# Patient Record
Sex: Female | Born: 1935 | Race: White | Hispanic: No | Smoking: Never smoker
Health system: Southern US, Community
[De-identification: ages and names within clinical notes are randomized; demographics above are authoritative.]

## PROBLEM LIST (undated history)

## (undated) DIAGNOSIS — E119 Type 2 diabetes mellitus without complications: Secondary | ICD-10-CM

## (undated) DIAGNOSIS — E079 Disorder of thyroid, unspecified: Secondary | ICD-10-CM

## (undated) DIAGNOSIS — I10 Essential (primary) hypertension: Secondary | ICD-10-CM

## (undated) HISTORY — PX: EYE SURGERY: SHX253

## (undated) HISTORY — PX: ABDOMINAL HYSTERECTOMY: SHX81

## (undated) HISTORY — PX: CHOLECYSTECTOMY: SHX55

## (undated) HISTORY — PX: SMALL INTESTINE SURGERY: SHX150

---

## 2005-01-30 ENCOUNTER — Ambulatory Visit: Payer: Self-pay | Admitting: Orthopaedic Surgery

## 2005-02-03 ENCOUNTER — Ambulatory Visit: Payer: Self-pay | Admitting: Orthopaedic Surgery

## 2007-03-20 ENCOUNTER — Emergency Department: Payer: Self-pay | Admitting: Emergency Medicine

## 2011-04-22 ENCOUNTER — Inpatient Hospital Stay: Payer: Self-pay | Admitting: Internal Medicine

## 2011-11-20 ENCOUNTER — Inpatient Hospital Stay: Payer: Self-pay | Admitting: Internal Medicine

## 2011-11-20 LAB — CBC WITH DIFFERENTIAL/PLATELET
Basophil #: 0 10*3/uL (ref 0.0–0.1)
Eosinophil #: 0 10*3/uL (ref 0.0–0.7)
Eosinophil %: 0.2 %
HCT: 38.2 % (ref 35.0–47.0)
HGB: 13.3 g/dL (ref 12.0–16.0)
Lymphocyte #: 0.5 10*3/uL — ABNORMAL LOW (ref 1.0–3.6)
Lymphocyte %: 4.2 %
MCH: 31 pg (ref 26.0–34.0)
MCV: 89 fL (ref 80–100)
Monocyte %: 2.9 %
Neutrophil #: 11.7 10*3/uL — ABNORMAL HIGH (ref 1.4–6.5)
Neutrophil %: 92.7 %
Platelet: 184 10*3/uL (ref 150–440)
RBC: 4.3 10*6/uL (ref 3.80–5.20)
WBC: 12.6 10*3/uL — ABNORMAL HIGH (ref 3.6–11.0)

## 2011-11-20 LAB — CK TOTAL AND CKMB (NOT AT ARMC)
CK, Total: 49 U/L (ref 21–215)
CK, Total: 68 U/L (ref 21–215)
CK-MB: 0.5 ng/mL (ref 0.5–3.6)

## 2011-11-20 LAB — COMPREHENSIVE METABOLIC PANEL
Albumin: 3.2 g/dL — ABNORMAL LOW (ref 3.4–5.0)
Anion Gap: 13 (ref 7–16)
Bilirubin,Total: 1.4 mg/dL — ABNORMAL HIGH (ref 0.2–1.0)
EGFR (Non-African Amer.): 30 — ABNORMAL LOW
Osmolality: 283 (ref 275–301)
Potassium: 3.8 mmol/L (ref 3.5–5.1)
SGOT(AST): 44 U/L — ABNORMAL HIGH (ref 15–37)
Total Protein: 6.9 g/dL (ref 6.4–8.2)

## 2011-11-20 LAB — URINALYSIS, COMPLETE
Bilirubin,UR: NEGATIVE
Glucose,UR: 150 mg/dL (ref 0–75)
Ketone: NEGATIVE
Nitrite: NEGATIVE
Ph: 5 (ref 4.5–8.0)
Specific Gravity: 1.012 (ref 1.003–1.030)
Squamous Epithelial: 1

## 2011-11-20 LAB — RAPID INFLUENZA A&B ANTIGENS

## 2011-11-20 LAB — TROPONIN I: Troponin-I: 0.09 ng/mL — ABNORMAL HIGH

## 2011-11-21 LAB — CBC WITH DIFFERENTIAL/PLATELET
Basophil #: 0 10*3/uL (ref 0.0–0.1)
Eosinophil %: 0.4 %
Lymphocyte #: 1 10*3/uL (ref 1.0–3.6)
Lymphocyte %: 8.1 %
MCV: 91 fL (ref 80–100)
Monocyte #: 1.1 10*3/uL — ABNORMAL HIGH (ref 0.0–0.7)
Monocyte %: 9.2 %
Neutrophil #: 10.2 10*3/uL — ABNORMAL HIGH (ref 1.4–6.5)
Platelet: 134 10*3/uL — ABNORMAL LOW (ref 150–440)
RBC: 3.6 10*6/uL — ABNORMAL LOW (ref 3.80–5.20)
RDW: 13.4 % (ref 11.5–14.5)
WBC: 12.4 10*3/uL — ABNORMAL HIGH (ref 3.6–11.0)

## 2011-11-21 LAB — LIPID PANEL
HDL Cholesterol: 31 mg/dL — ABNORMAL LOW (ref 40–60)
Triglycerides: 104 mg/dL (ref 0–200)

## 2011-11-21 LAB — CK TOTAL AND CKMB (NOT AT ARMC)
CK, Total: 89 U/L (ref 21–215)
CK-MB: 0.8 ng/mL (ref 0.5–3.6)

## 2011-11-21 LAB — BASIC METABOLIC PANEL
Calcium, Total: 7.8 mg/dL — ABNORMAL LOW (ref 8.5–10.1)
Chloride: 103 mmol/L (ref 98–107)
EGFR (African American): 32 — ABNORMAL LOW
EGFR (Non-African Amer.): 27 — ABNORMAL LOW
Osmolality: 282 (ref 275–301)
Sodium: 136 mmol/L (ref 136–145)

## 2011-11-21 LAB — HEPATIC FUNCTION PANEL A (ARMC)
SGOT(AST): 26 U/L (ref 15–37)
SGPT (ALT): 22 U/L

## 2011-11-21 LAB — URINE CULTURE

## 2011-11-21 LAB — TROPONIN I: Troponin-I: 0.1 ng/mL — ABNORMAL HIGH

## 2011-11-22 LAB — BASIC METABOLIC PANEL
Anion Gap: 11 (ref 7–16)
BUN: 29 mg/dL — ABNORMAL HIGH (ref 7–18)
Calcium, Total: 7.9 mg/dL — ABNORMAL LOW (ref 8.5–10.1)
Chloride: 104 mmol/L (ref 98–107)
Co2: 22 mmol/L (ref 21–32)
EGFR (African American): 39 — ABNORMAL LOW
EGFR (Non-African Amer.): 32 — ABNORMAL LOW
Glucose: 231 mg/dL — ABNORMAL HIGH (ref 65–99)
Sodium: 137 mmol/L (ref 136–145)

## 2011-11-22 LAB — CBC WITH DIFFERENTIAL/PLATELET
Basophil %: 0 %
HCT: 31.3 % — ABNORMAL LOW (ref 35.0–47.0)
Lymphocyte %: 7.6 %
MCHC: 33.4 g/dL (ref 32.0–36.0)
Monocyte %: 6 %
Neutrophil %: 86.4 %
Platelet: 127 10*3/uL — ABNORMAL LOW (ref 150–440)
RDW: 13.8 % (ref 11.5–14.5)
WBC: 11.4 10*3/uL — ABNORMAL HIGH (ref 3.6–11.0)

## 2011-11-22 LAB — CULTURE, BLOOD (SINGLE)

## 2011-11-23 LAB — CBC WITH DIFFERENTIAL/PLATELET
Basophil #: 0 10*3/uL (ref 0.0–0.1)
Basophil %: 0.2 %
Eosinophil #: 0 10*3/uL (ref 0.0–0.7)
HCT: 29.7 % — ABNORMAL LOW (ref 35.0–47.0)
Lymphocyte #: 1.2 10*3/uL (ref 1.0–3.6)
Lymphocyte %: 11.5 %
Monocyte #: 1.2 10*3/uL — ABNORMAL HIGH (ref 0.0–0.7)
Monocyte %: 11.2 %
Platelet: 147 10*3/uL — ABNORMAL LOW (ref 150–440)
RBC: 3.31 10*6/uL — ABNORMAL LOW (ref 3.80–5.20)
RDW: 13.3 % (ref 11.5–14.5)
WBC: 10.8 10*3/uL (ref 3.6–11.0)

## 2011-11-23 LAB — COMPREHENSIVE METABOLIC PANEL
Alkaline Phosphatase: 81 U/L (ref 50–136)
Anion Gap: 8 (ref 7–16)
BUN: 25 mg/dL — ABNORMAL HIGH (ref 7–18)
Calcium, Total: 8.1 mg/dL — ABNORMAL LOW (ref 8.5–10.1)
Chloride: 103 mmol/L (ref 98–107)
Co2: 22 mmol/L (ref 21–32)
EGFR (African American): 42 — ABNORMAL LOW
EGFR (Non-African Amer.): 35 — ABNORMAL LOW
Glucose: 180 mg/dL — ABNORMAL HIGH (ref 65–99)
Osmolality: 275 (ref 275–301)
Potassium: 3.9 mmol/L (ref 3.5–5.1)
Sodium: 133 mmol/L — ABNORMAL LOW (ref 136–145)
Total Protein: 5.7 g/dL — ABNORMAL LOW (ref 6.4–8.2)

## 2011-11-24 LAB — CBC WITH DIFFERENTIAL/PLATELET
Basophil #: 0 10*3/uL (ref 0.0–0.1)
Basophil %: 0.5 %
Eosinophil %: 2.4 %
HCT: 30.9 % — ABNORMAL LOW (ref 35.0–47.0)
HGB: 10.3 g/dL — ABNORMAL LOW (ref 12.0–16.0)
Lymphocyte #: 1.9 10*3/uL (ref 1.0–3.6)
Lymphocyte %: 22.1 %
MCH: 30.1 pg (ref 26.0–34.0)
MCHC: 33.3 g/dL (ref 32.0–36.0)
MCV: 90 fL (ref 80–100)
Monocyte #: 1.5 10*3/uL — ABNORMAL HIGH (ref 0.0–0.7)
Monocyte %: 17.3 %
Neutrophil %: 57.7 %
Platelet: 156 10*3/uL (ref 150–440)
RDW: 13.3 % (ref 11.5–14.5)
WBC: 8.8 10*3/uL (ref 3.6–11.0)

## 2011-11-24 LAB — BASIC METABOLIC PANEL
BUN: 22 mg/dL — ABNORMAL HIGH (ref 7–18)
Calcium, Total: 8.2 mg/dL — ABNORMAL LOW (ref 8.5–10.1)
Co2: 28 mmol/L (ref 21–32)
EGFR (African American): 44 — ABNORMAL LOW
Sodium: 135 mmol/L — ABNORMAL LOW (ref 136–145)

## 2014-11-13 ENCOUNTER — Ambulatory Visit: Payer: Self-pay | Admitting: Urology

## 2015-03-11 NOTE — H&P (Signed)
PATIENT NAME:  Sierra Warren, Sierra Warren MR#:  [id number] DATE OF BIRTH:  [birth year]  DATE OF ADMISSION:  11/20/2011   REFERRING PHYSICIAN: Dr. Lorenso Courier   PRIMARY CARE PHYSICIAN: Bethann Punches, MD   REASON FOR ADMISSION: Fever, urinary tract infection, pyelonephritis, sepsis.   HISTORY OF PRESENT ILLNESS: This is an 81 year old white female with past medical history of diabetes, hypertension, and transient ischemic attack who presents with fever which began around 3:30 a.m. They called 9-1-1. The patient had fallen. She had not had any head trauma. She had fever as high as 104, glucose 30, low blood pressure as per EMS. She came in with systolics in the 80's, given IV fluids, found to have a urinary tract infection and started on Cipro. The patient states that she had nausea and vomiting yesterday, fevers and chills yesterday, subjective chills. She was given Tylenol at home with minimal improvement and then spiked her fever and fell. She is also complaining of right flank pain. No chest pain. No shortness of breath. No lower extremity edema. We are asked to admit the patient for sepsis.    PAST MEDICAL HISTORY: 1. Diverticulosis status post GI bleed. 2. Hypothyroidism. 3. Postmenopausal state.  4. Diabetes. 5. Transient ischemic attack.  6. Hypertension.  7. Status post left shoulder dislocation left clavicle fracture.   PAST SURGICAL HISTORY:  1. Hysterectomy.  2. Cholecystectomy.   MEDICATIONS:  1. Ambien 10 mg daily.  2. Glimepiride 2 mg 2 tablets by mouth every morning. 3. Diovan/hydrochlorothiazide 80/12.5 once daily.  4. Lorazepam 1 mg by mouth as needed.  5. Mirapex 0.125 mg at bedtime. 6. Synthroid 100 mcg daily.  7. She said she just got a prescription for Januvia and does not know the dose.   DRUG ALLERGIES: Penicillin, codeine, Glucophage, temazepam.   FAMILY HISTORY: Brother and father both had lung cancer. Father died at age 47. Mother died in her 66's of coronary artery disease and  MI.   SOCIAL HISTORY: She does not smoke, drink, or use illicit drugs. She lives with her daughter. She has four children living, one who passed away of motor vehicle accident. She is able to do her activities of daily living normally.   REVIEW OF SYSTEMS: CONSTITUTIONAL: She has fever, fatigue, weakness. EYES: No blurred vision, double vision, pain, redness, inflammation, glaucoma. ENT: No tinnitus, ear pain, ear pain, allergies, epistaxis, discharge. She does have hearing loss. RESPIRATORY: No cough, wheezing, hemoptysis, painful respirations. CARDIOVASCULAR: No chest pain, orthopnea, edema, arrhythmia, dyspnea on exertion, palpitations, syncope. GI: Nausea and vomiting. She did have diarrhea yesterday. No abdominal pain, hematemesis, melena, gastroesophageal reflux disease. GU: No dysuria, hematuria, renal calculi, increased frequency. GYN: No breast mass, tenderness, vaginal discharge. She is postmenopausal. ENDOCRINE: No polyuria, nocturia, thyroid problems, heat or cold intolerance. HEME/LYMPH: No anemia, bruising, swollen cancer. INTEGUMENTARY: No acne, rash, change in mole, hair or skin. MUSCULOSKELETAL: No pain in back, shoulder, knee, hip, arthritis, swelling, gout. NEUROLOGIC: No numbness, weakness, dysarthria, epilepsy, tremor, vertigo, ataxia. PSYCH: No anxiety, insomnia, ADD, bipolar, depression.   PHYSICAL EXAMINATION:   VITAL SIGNS: Temperature 102, heart rate 114, respiratory rate 18, blood pressure 112/52, sating 88% on room air, now 95% on 2 liters.   GENERAL: The patient is well developed, well nourished, in no apparent distress, alert and oriented x3.   HEENT: Pupils equal and reactive to light and accommodation. Extraocular movements intact. Anicteric sclerae. No difficulty hearing. Oropharynx clear.   NECK: No JVD. No thyromegaly. No lymphadenopathy. No carotid bruits.  LUNGS: Clear to auscultation. No adventitious breath sounds. No increased work of breathing.    CARDIOVASCULAR: Regular, mildly tachycardic. No murmurs, gallops, or rubs appreciated. No lower extremity edema.   BREASTS: No obvious masses.   ABDOMEN: Soft, nontender, nondistended. Positive bowel sounds with flank pain bilaterally.   GU: Deferred.   MUSCULOSKELETAL: Strength 5 out of 5. No clubbing, cyanosis, or degenerative joint disease.   SKIN: No rashes, lesions, or induration.    LYMPH: No lymphadenopathy in cervical or supraclavicular area.   NEUROLOGIC: Cranial nerves II through XII are intact. Strength 5 out of 5.   PSYCH: Alert and oriented x3.   LABORATORY, DIAGNOSTIC, AND RADIOLOGICAL DATA: EKG shows sinus tachycardia. Glucose 288, BUN 29, creatinine 1.72, sodium 133, potassium 3.9, chloride 99, bicarb 21, anion gap 13, total protein 6.9, albumin 3.2, total bilirubin 1.4, alkaline phosphatase 96, AST 44, ALT 33. CK 49. MB less than 0.5. Troponin 0.12. White count 12.6, hemoglobin 13, hematocrit 38.2, platelets 184. Urinalysis showed 2+ bacteria, WBCs 114. Influenza was negative for A and B. Chest x-ray showed no acute changes identified.   ASSESSMENT AND PLAN: This is a pleasant 81 year old white female with history of diabetes, hypertension, and hypothyroidism who presents with fever, nausea, vomiting, and flank pain.  1. Sepsis as evidenced by tachycardia, fever, leukocytosis, hypertension most likely secondary to urinary tract infection. Will give IV Cipro which should be renally dosed. P.r.n. Tylenol. IV fluids. Send off blood cultures and urine cultures which were not sent by the ER earlier. Unfortunately, she has already received antibiotics before getting these cultures.  2. Acute renal failure from dehydration. Will avoid nephrotoxins and give IV fluids.   3. Respiratory failure. Will check ABG. This may be just from weakness and atelectasis.  4. Elevated troponin. This most likely is sepsis versus acute renal failure versus demand ischemia. Will give aspirin,  nitroglycerin, and p.r.n. Motrin. The patient cannot tolerate beta-blocker due to her hypotension. Will cycle cardiac enzymes. If troponin increases, will fully anti-coagulate.  5. Hypothyroidism. Will continue Synthroid.  6. Diabetes. Will continue sliding scale insulin and hold her oral hypoglycemics.  7. Elevated total bilirubin and AST. Most likely this is from sepsis. Will repeat hepatic function in the morning.   8. Low albumin. Will screen for malnutrition.  9. DVT prophylaxis. Maintain with aspirin and heparin subcutaneous.   10. The patient will get physical therapy evaluation and may need short-term rehab.   CODE STATUS: FULL CODE.   TOTAL TIME SPENT ON ADMISSION: 55 minutes of critical care time.   Thank you for allowing me to participate in the care of this patient.   ____________________________ Corie Chiquito. Lafayette Dragon, MD aaf:drc D: 11/20/2011 12:01:33 ET T: 11/20/2011 13:18:04 ET JOB#: [id number]  cc: Karolee Ohs A. Lafayette Dragon, MD, <Dictator> Danella Penton, MD Karolee Ohs Laverda Page MD ELECTRONICALLY SIGNED 11/20/2011 16:28

## 2015-03-11 NOTE — Discharge Summary (Signed)
PATIENT NAME:  Sierra Warren, Sierra Warren MR#:  [id number] DATE OF BIRTH:  1921-03-06  DATE OF ADMISSION:  11/20/2011 DATE OF DISCHARGE:  11/24/2011  DISCHARGE DIAGNOSES:  1. Systemic inflammatory response syndrome due to Escherichia coli sepsis.  2. Bronchitis.  3. Diabetes mellitus, non-insulin-requiring.  4. History of transient ischemic attack.  5. Hypothyroidism.  6. Hypertension.   DISCHARGE MEDICATIONS:  1. Ambien 10 mg at bedtime p.r.n.  2. Januvia 50 mg q.a.m.  3. Synthroid 100 mcg daily.  4. Lorazepam 1 mg daily p.r.n.  5. Mirapex 0.125 mg at bedtime.  6. Glimepiride 2 mg two tabs daily.  7. Levaquin 500 mg daily x5 days.  8. Tussionex 1 tsp b.i.d. p.r.n. cough.   REASON FOR ADMISSION: Elderly female who presents with hypotension, tachycardia, fever, and suprapubic pain with cough. Please see history and physical for history of present illness, past medical history, and physical exam.   HOSPITAL COURSE: The patient was admitted, found to have Escherichia coli in her blood x2 bottles sensitive to cefazolin. She was treated with Rocephin and Levaquin throughout the hospital course. Her fever improved. Her white count dropped down to 8000. She had some progressive cough and congestion. Chest x-ray did not show an infiltrate. Clinical exam was consistent with bronchitis. Her blood pressure medicine, Diovan, was held due to mild hypotension.     She had shown dramatic improvement and will go home on the above medications to follow-up with Dr. Hyacinth Meeker later this week.    ____________________________ Danella Penton, MD mfm:rbg D: 11/24/2011 08:09:00 ET T: 11/25/2011 11:25:11 ET JOB#: [id number]  cc: Danella Penton, MD, <Dictator> MARK Sherlene Shams MD ELECTRONICALLY SIGNED 11/27/2011 18:06

## 2015-11-23 ENCOUNTER — Ambulatory Visit: Payer: Self-pay | Admitting: Urology

## 2015-11-26 ENCOUNTER — Emergency Department: Payer: PPO

## 2015-11-26 ENCOUNTER — Encounter: Payer: Self-pay | Admitting: Emergency Medicine

## 2015-11-26 ENCOUNTER — Emergency Department
Admission: EM | Admit: 2015-11-26 | Discharge: 2015-11-26 | Disposition: A | Discharge status: Home / Self Care | Payer: PPO | Attending: Emergency Medicine | Admitting: Emergency Medicine

## 2015-11-26 DIAGNOSIS — R05 Cough: Secondary | ICD-10-CM | POA: Diagnosis not present

## 2015-11-26 DIAGNOSIS — R251 Tremor, unspecified: Secondary | ICD-10-CM | POA: Insufficient documentation

## 2015-11-26 DIAGNOSIS — R200 Anesthesia of skin: Secondary | ICD-10-CM | POA: Diagnosis not present

## 2015-11-26 DIAGNOSIS — N3000 Acute cystitis without hematuria: Secondary | ICD-10-CM | POA: Insufficient documentation

## 2015-11-26 DIAGNOSIS — Z880 Allergy status to penicillin: Secondary | ICD-10-CM | POA: Diagnosis not present

## 2015-11-26 DIAGNOSIS — I10 Essential (primary) hypertension: Secondary | ICD-10-CM | POA: Diagnosis not present

## 2015-11-26 DIAGNOSIS — G252 Other specified forms of tremor: Secondary | ICD-10-CM

## 2015-11-26 DIAGNOSIS — E119 Type 2 diabetes mellitus without complications: Secondary | ICD-10-CM | POA: Insufficient documentation

## 2015-11-26 HISTORY — DX: Disorder of thyroid, unspecified: E07.9

## 2015-11-26 HISTORY — DX: Type 2 diabetes mellitus without complications: E11.9

## 2015-11-26 HISTORY — DX: Essential (primary) hypertension: I10

## 2015-11-26 LAB — GLUCOSE, CAPILLARY: Glucose-Capillary: 192 mg/dL — ABNORMAL HIGH (ref 65–99)

## 2015-11-26 LAB — COMPREHENSIVE METABOLIC PANEL
ALBUMIN: 3.8 g/dL (ref 3.5–5.0)
ALT: 15 U/L (ref 14–54)
AST: 20 U/L (ref 15–41)
Alkaline Phosphatase: 101 U/L (ref 38–126)
Anion gap: 9 (ref 5–15)
BUN: 29 mg/dL — ABNORMAL HIGH (ref 6–20)
CO2: 25 mmol/L (ref 22–32)
Calcium: 9 mg/dL (ref 8.9–10.3)
Chloride: 97 mmol/L — ABNORMAL LOW (ref 101–111)
Creatinine, Ser: 1.34 mg/dL — ABNORMAL HIGH (ref 0.44–1.00)
GFR calc Af Amer: 39 mL/min — ABNORMAL LOW (ref >60)
GFR calc non Af Amer: 33 mL/min — ABNORMAL LOW (ref >60)
GLUCOSE: 196 mg/dL — AB (ref 65–99)
POTASSIUM: 4.3 mmol/L (ref 3.5–5.1)
SODIUM: 131 mmol/L — AB (ref 135–145)
TOTAL PROTEIN: 6.8 g/dL (ref 6.5–8.1)
Total Bilirubin: 0.7 mg/dL (ref 0.3–1.2)

## 2015-11-26 LAB — CBC WITH DIFFERENTIAL/PLATELET
Basophils Absolute: 0.1 10*3/uL (ref 0–0.1)
Basophils Relative: 1 %
Eosinophils Absolute: 0.2 10*3/uL (ref 0–0.7)
Eosinophils Relative: 2 %
HEMATOCRIT: 36.1 % (ref 35.0–47.0)
Hemoglobin: 12.2 g/dL (ref 12.0–16.0)
LYMPHS ABS: 3.4 10*3/uL (ref 1.0–3.6)
LYMPHS PCT: 27 %
MCH: 29.8 pg (ref 26.0–34.0)
MCHC: 33.7 g/dL (ref 32.0–36.0)
MCV: 88.3 fL (ref 80.0–100.0)
MONO ABS: 0.9 10*3/uL (ref 0.2–0.9)
MONOS PCT: 8 %
NEUTROS ABS: 7.9 10*3/uL — AB (ref 1.4–6.5)
Neutrophils Relative %: 62 %
Platelets: 246 10*3/uL (ref 150–440)
RBC: 4.09 MIL/uL (ref 3.80–5.20)
RDW: 12.7 % (ref 11.5–14.5)
WBC: 12.6 10*3/uL — ABNORMAL HIGH (ref 3.6–11.0)

## 2015-11-26 LAB — URINALYSIS COMPLETE WITH MICROSCOPIC (ARMC ONLY)
Bilirubin Urine: NEGATIVE
Glucose, UA: NEGATIVE mg/dL
KETONES UR: NEGATIVE mg/dL
NITRITE: POSITIVE — AB
PH: 5 (ref 5.0–8.0)
Protein, ur: NEGATIVE mg/dL
Specific Gravity, Urine: 1.004 — ABNORMAL LOW (ref 1.005–1.030)

## 2015-11-26 LAB — TSH: TSH: 0.944 u[IU]/mL (ref 0.350–4.500)

## 2015-11-26 LAB — MAGNESIUM: Magnesium: 1.7 mg/dL (ref 1.7–2.4)

## 2015-11-26 LAB — TROPONIN I: Troponin I: <0.03 ng/mL (ref <0.031)

## 2015-11-26 MED ORDER — MAGNESIUM SULFATE 2 GM/50ML IV SOLN
2.0000 g | Freq: Once | INTRAVENOUS | Status: AC
  Administered 2015-11-26: 2 g via INTRAVENOUS
  Filled 2015-11-26: qty 50

## 2015-11-26 MED ORDER — DEXTROSE 5 % IV SOLN
1.0000 g | Freq: Once | INTRAVENOUS | Status: AC
  Administered 2015-11-26: 1 g via INTRAVENOUS
  Filled 2015-11-26: qty 10

## 2015-11-26 MED ORDER — CEPHALEXIN 500 MG PO CAPS: 500.0000 mg | ORAL_CAPSULE | Freq: Four times a day (QID) | ORAL | Status: AC

## 2015-11-26 MED ORDER — SODIUM CHLORIDE 0.9 % IV BOLUS (SEPSIS)
1000.0000 mL | Freq: Once | INTRAVENOUS | Status: AC
  Administered 2015-11-26: 1000 mL via INTRAVENOUS

## 2015-11-26 NOTE — ED Provider Notes (Signed)
[facility] Emergency Department Provider Note  ____________________________________________  Time seen: Approximately 2:31 AM  I have reviewed the triage vital signs and the nursing notes.   HISTORY  Chief Complaint Tremors    HPI Sierra Warren is a 82 y.o. female who comes into the hospital today with tremors. The patient woke her family member up and said that she was quivering all over. She also felt as though her hands were numb. The patient saw her doctor last week on Wednesday and was told that she had a UTI and placed on levofloxacin. The patient is also had a cough and has been on azithromycin but due to the continued cough her doctor suggested putting her on Levophed ofloxacin. Around 9 PM the patient laid down and reports that the tremors started suddenly. She reports that she felt trembly all over her body was jerking her arms and the legs. She laid there not wanting to wake up her family member but reports that she kept feeling worse and worse so she decided to come in. The patient reports that the hand numbness started the same time as the trembling. When asked if she has any numbness progressing up her arms she keeps referring to being trembly and feeling the symptoms all over her body. The patient denies feeling cold denies chest pain or shortness of breath she reports that she has a bad cough but has not had any fevers vomiting or abdominal pain. The agents family brought her in for evaluation.   Past Medical History  Diagnosis Date  . Diabetes mellitus without complication (HCC)   . Thyroid disease   . Hypertension     There are no active problems to display for this patient.   History reviewed. No pertinent past surgical history.  Current Outpatient Rx  Name  Route  Sig  Dispense  Refill  . cephALEXin (KEFLEX) 500 MG capsule   Oral   Take 1 capsule (500 mg total) by mouth 4 (four) times daily.   40 capsule   0      Allergies Penicillins  No family history on file.  Social History Social History  Substance Use Topics  . Smoking status: Never Smoker   . Smokeless tobacco: None  . Alcohol Use: No    Review of Systems Constitutional: No fever/chills Eyes: No visual changes. ENT: No sore throat. Cardiovascular: Denies chest pain. Respiratory: Denies shortness of breath. Gastrointestinal: No abdominal pain.  No nausea, no vomiting.  No diarrhea.  No constipation. Genitourinary: Negative for dysuria. Musculoskeletal: Negative for back pain. Skin: Negative for rash. Neurological: Tremors with no headaches, focal weakness or numbness.  10-point ROS otherwise negative.  ____________________________________________   PHYSICAL EXAM:  VITAL SIGNS: ED Triage Vitals  Enc Vitals Group     BP 11/26/15 0106 184/92 mmHg     Pulse Rate 11/26/15 0106 91     Resp 11/26/15 0106 16     Temp --      Temp Source 11/26/15 0106 Oral     SpO2 11/26/15 0106 98 %     Weight --      Height --      Head Cir --      Peak Flow --      Pain Score 11/26/15 0106 0     Pain Loc --      Pain Edu? --      Excl. in GC? --     Constitutional: Alert and oriented. Well appearing and  in mild distress. Eyes: Conjunctivae are normal. PERRL. EOMI. Head: Atraumatic. Nose: No congestion/rhinnorhea. Mouth/Throat: Mucous membranes are moist.  Oropharynx non-erythematous. Cardiovascular: Normal rate, regular rhythm. Grossly normal heart sounds.  Good peripheral circulation. Respiratory: Normal respiratory effort.  No retractions. Lungs CTAB. Gastrointestinal: Soft some mild lower abdominal pain to palpation. No distention. Positive bowel sounds Musculoskeletal: No lower extremity tenderness nor edema.  Neurologic:  Normal speech and language.  Skin:  Skin is warm, dry and intact.  Psychiatric: Mood and affect are normal.   ____________________________________________   LABS (all labs ordered are listed, but  only abnormal results are displayed)  Labs Reviewed  GLUCOSE, CAPILLARY - Abnormal; Notable for the following:    Glucose-Capillary 192 (*)    All other components within normal limits  COMPREHENSIVE METABOLIC PANEL - Abnormal; Notable for the following:    Sodium 131 (*)    Chloride 97 (*)    Glucose, Bld 196 (*)    BUN 29 (*)    Creatinine, Ser 1.34 (*)    GFR calc non Af Amer 33 (*)    GFR calc Af Amer 39 (*)    All other components within normal limits  CBC WITH DIFFERENTIAL/PLATELET - Abnormal; Notable for the following:    WBC 12.6 (*)    Neutro Abs 7.9 (*)    All other components within normal limits  URINALYSIS COMPLETEWITH MICROSCOPIC ([facility] ONLY) - Abnormal; Notable for the following:    Color, Urine STRAW (*)    APPearance HAZY (*)    Specific Gravity, Urine 1.004 (*)    Hgb urine dipstick 1+ (*)    Nitrite POSITIVE (*)    Leukocytes, UA 2+ (*)    Bacteria, UA MANY (*)    Squamous Epithelial / LPF 0-5 (*)    All other components within normal limits  URINE CULTURE  TROPONIN I  TSH  MAGNESIUM   ____________________________________________  EKG  ED ECG REPORT I, Rebecka Apley, the attending physician, personally viewed and interpreted this ECG.   Date: 11/26/2015  EKG Time: 111  Rate: 87  Rhythm: normal sinus rhythm  Axis: normal  Intervals:none  ST&T Change: none  ____________________________________________  RADIOLOGY  Chest x-ray: Left basilar atelectasis or scarring, chronic blunting of left costophrenic angle, no superimposed acute process. CT head: No acute intracranial hemorrhage, age-related atrophy and chronic microvascular ischemic disease. ____________________________________________   PROCEDURES  Procedure(s) performed: None  Critical Care performed: No  ____________________________________________   INITIAL IMPRESSION / ASSESSMENT AND PLAN / ED COURSE  Pertinent labs & imaging results that were available during my care  of the patient were reviewed by me and considered in my medical decision making (see chart for details).  This is a 82 year old female who comes into the hospital today with tremors. The patient's family reports that she had similar tremors a few years ago and was found to have a severe urinary tract infection with sepsis and required a long stay in the hospital. The patient is not complaining of any other symptoms aside from some mild lower abdominal discomfort. I will check the patient's urine and reassess her blood work. Once I received the results of the patient's urine and blood tests I will reassess the patient and determine her disposition.  It appears as though the patient has some urinary tract infection. I did give the patient a dose of ceftriaxone for her symptoms. The patient will be discharged home to follow back up with her primary care physician. I will  also send urine cultures the patient has been on other antibiotics which has not cleared this UTI. ____________________________________________   FINAL CLINICAL IMPRESSION(S) / ED DIAGNOSES  Final diagnoses:  Coarse tremors  Acute cystitis without hematuria      Rebecka Apley, MD 11/26/15 [phone]

## 2015-11-26 NOTE — ED Notes (Addendum)
Pt comes from home via EMS for tremors x2-3 hours. Pt denies any new meds. Pt neuro status neg. Pt glucose is 192. Pt A&O

## 2015-11-26 NOTE — ED Notes (Signed)
Attempted to place in and out catheter for urine sample. Unsuccessful attempts x2. Will offer water and attempt later.

## 2015-11-26 NOTE — ED Notes (Signed)
Patient transported to CT and X ray 

## 2015-11-26 NOTE — Discharge Instructions (Signed)
Tremor A tremor is trembling or shaking that you cannot control. Most tremors affect the hands or arms. Tremors can also affect the head, vocal cords, face, and other parts of the body. There are many types of tremors. Common types include:   Essential tremor. These usually occur in people over the age of 47. It may run in families and can happen in otherwise healthy people.   Resting tremor. These occur when the muscles are at rest, such as when your hands are resting in your lap. People with Parkinson disease often have resting tremors.   Postural tremor. These occur when you try to hold a pose, such as keeping your hands outstretched.   Kinetic tremor. These occur during purposeful movement, such as trying to touch a finger to your nose.   Task-specific tremor. These may occur when you perform tasks such as handwriting, speaking, or standing.   Psychogenic tremor. These dramatically lessen or disappear when you are distracted. They can happen in people of all ages.  Some types of tremors have no known cause. Tremors can also be a symptom of nervous system problems (neurological disorders) that may occur with aging. Some tremors go away with treatment while others do not.  HOME CARE INSTRUCTIONS Watch your tremor for any changes. The following actions may help to lessen any discomfort you are feeling:  Take medicines only as directed by your health care provider.   Limit alcohol intake to no more than 1 drink per day for nonpregnant women and 2 drinks per day for men. One drink equals 12 oz of beer, 5 oz of wine, or 1 oz of hard liquor.  Do not use any tobacco products, including cigarettes, chewing tobacco, or electronic cigarettes. If you need help quitting, ask your health care provider.   Avoid extreme heat or cold.   Limit the amount of caffeine you consumeas directed by your health care provider.   Try to get 8 hours of sleep each night.  Find ways to manage your  stress, such as meditation or yoga.  Keep all follow-up visits as directed by your health care provider. This is important. SEEK MEDICAL CARE IF:  You start having a tremor after starting a new medicine.  You have tremor with other symptoms such as:  Numbness.  Tingling.  Pain.  Weakness.  Your tremor gets worse.  Your tremor interferes with your day-to-day life.   This information is not intended to replace advice given to you by your health care provider. Make sure you discuss any questions you have with your health care provider.   Document Released: 10/24/2002 Document Revised: 11/24/2014 Document Reviewed: 05/01/2014 Elsevier Interactive Patient Education 2016 [employer]  Urinary Tract Infection Urinary tract infections (UTIs) can develop anywhere along your urinary tract. Your urinary tract is your body's drainage system for removing wastes and extra water. Your urinary tract includes two kidneys, two ureters, a bladder, and a urethra. Your kidneys are a pair of bean-shaped organs. Each kidney is about the size of your fist. They are located below your ribs, one on each side of your spine. CAUSES Infections are caused by microbes, which are microscopic organisms, including fungi, viruses, and bacteria. These organisms are so small that they can only be seen through a microscope. Bacteria are the microbes that most commonly cause UTIs. SYMPTOMS  Symptoms of UTIs may vary by age and gender of the patient and by the location of the infection. Symptoms in young women typically include a  and intense urge to urinate and a painful, burning feeling in the bladder or urethra during urination. Older women and men are more likely to be tired, shaky, and weak and have muscle aches and abdominal pain. A fever may mean the infection is in your kidneys. Other symptoms of a kidney infection include pain in your back or sides below the ribs, nausea, and vomiting. °DIAGNOSIS °To  diagnose a UTI, your caregiver will ask you about your symptoms. Your caregiver will also ask you to provide a urine sample. The urine sample will be tested for bacteria and white blood cells. White blood cells are made by your body to help fight infection. °TREATMENT  °Typically, UTIs can be treated with medication. Because most UTIs are caused by a bacterial infection, they usually can be treated with the use of antibiotics. The choice of antibiotic and length of treatment depend on your symptoms and the type of bacteria causing your infection. °HOME CARE INSTRUCTIONS °· If you were prescribed antibiotics, take them exactly as your caregiver instructs you. Finish the medication even if you feel better after you have only taken some of the medication. °· Drink enough water and fluids to keep your urine clear or pale yellow. °· Avoid caffeine, tea, and carbonated beverages. They tend to irritate your bladder. °· Empty your bladder often. Avoid holding urine for long periods of time. °· Empty your bladder before and after sexual intercourse. °· After a bowel movement, women should cleanse from front to back. Use each tissue only once. °SEEK MEDICAL CARE IF:  °· You have back pain. °· You develop a fever. °· Your symptoms do not begin to resolve within 3 days. °SEEK IMMEDIATE MEDICAL CARE IF:  °· You have severe back pain or lower abdominal pain. °· You develop chills. °· You have nausea or vomiting. °· You have continued burning or discomfort with urination. °MAKE SURE YOU:  °· Understand these instructions. °· Will watch your condition. °· Will get help right away if you are not doing well or get worse. °  °This information is not intended to replace advice given to you by your health care provider. Make sure you discuss any questions you have with your health care provider. °  °Document Released: 08/13/2005 Document Revised: 07/25/2015 Document Reviewed: 12/12/2011 °Elsevier Interactive Patient Education ©2016  Elsevier Inc. ° °

## 2015-11-26 NOTE — ED Notes (Signed)
MD at bedside. 

## 2015-11-26 NOTE — ED Notes (Signed)
Patient back from CT and xray

## 2015-11-29 LAB — URINE CULTURE: Culture: >=100000

## 2016-04-03 ENCOUNTER — Emergency Department: Payer: PPO

## 2016-04-03 ENCOUNTER — Inpatient Hospital Stay
Admission: EM | Admit: 2016-04-03 | Discharge: 2016-04-07 | DRG: 480 | Disposition: A | Discharge status: Skilled Nursing Facility | Payer: PPO | Attending: Internal Medicine | Admitting: Internal Medicine

## 2016-04-03 ENCOUNTER — Encounter: Payer: Self-pay | Admitting: Emergency Medicine

## 2016-04-03 DIAGNOSIS — E1122 Type 2 diabetes mellitus with diabetic chronic kidney disease: Secondary | ICD-10-CM | POA: Diagnosis present

## 2016-04-03 DIAGNOSIS — W010XXA Fall on same level from slipping, tripping and stumbling without subsequent striking against object, initial encounter: Secondary | ICD-10-CM | POA: Diagnosis present

## 2016-04-03 DIAGNOSIS — S72009A Fracture of unspecified part of neck of unspecified femur, initial encounter for closed fracture: Secondary | ICD-10-CM | POA: Diagnosis present

## 2016-04-03 DIAGNOSIS — R0902 Hypoxemia: Secondary | ICD-10-CM | POA: Diagnosis not present

## 2016-04-03 DIAGNOSIS — R739 Hyperglycemia, unspecified: Secondary | ICD-10-CM | POA: Diagnosis not present

## 2016-04-03 DIAGNOSIS — R059 Cough, unspecified: Secondary | ICD-10-CM

## 2016-04-03 DIAGNOSIS — Y92008 Other place in unspecified non-institutional (private) residence as the place of occurrence of the external cause: Secondary | ICD-10-CM | POA: Diagnosis not present

## 2016-04-03 DIAGNOSIS — I952 Hypotension due to drugs: Secondary | ICD-10-CM | POA: Diagnosis not present

## 2016-04-03 DIAGNOSIS — E039 Hypothyroidism, unspecified: Secondary | ICD-10-CM | POA: Diagnosis present

## 2016-04-03 DIAGNOSIS — N170 Acute kidney failure with tubular necrosis: Secondary | ICD-10-CM | POA: Diagnosis not present

## 2016-04-03 DIAGNOSIS — Z419 Encounter for procedure for purposes other than remedying health state, unspecified: Secondary | ICD-10-CM

## 2016-04-03 DIAGNOSIS — Y92230 Patient room in hospital as the place of occurrence of the external cause: Secondary | ICD-10-CM | POA: Diagnosis not present

## 2016-04-03 DIAGNOSIS — T380X5A Adverse effect of glucocorticoids and synthetic analogues, initial encounter: Secondary | ICD-10-CM | POA: Diagnosis not present

## 2016-04-03 DIAGNOSIS — Z7984 Long term (current) use of oral hypoglycemic drugs: Secondary | ICD-10-CM

## 2016-04-03 DIAGNOSIS — E871 Hypo-osmolality and hyponatremia: Secondary | ICD-10-CM | POA: Diagnosis present

## 2016-04-03 DIAGNOSIS — Z7982 Long term (current) use of aspirin: Secondary | ICD-10-CM | POA: Diagnosis not present

## 2016-04-03 DIAGNOSIS — I129 Hypertensive chronic kidney disease with stage 1 through stage 4 chronic kidney disease, or unspecified chronic kidney disease: Secondary | ICD-10-CM | POA: Diagnosis present

## 2016-04-03 DIAGNOSIS — S72141A Displaced intertrochanteric fracture of right femur, initial encounter for closed fracture: Secondary | ICD-10-CM | POA: Diagnosis present

## 2016-04-03 DIAGNOSIS — N183 Chronic kidney disease, stage 3 (moderate): Secondary | ICD-10-CM | POA: Diagnosis present

## 2016-04-03 DIAGNOSIS — R05 Cough: Secondary | ICD-10-CM

## 2016-04-03 DIAGNOSIS — D649 Anemia, unspecified: Secondary | ICD-10-CM | POA: Diagnosis present

## 2016-04-03 DIAGNOSIS — D62 Acute posthemorrhagic anemia: Secondary | ICD-10-CM | POA: Diagnosis not present

## 2016-04-03 DIAGNOSIS — Z8249 Family history of ischemic heart disease and other diseases of the circulatory system: Secondary | ICD-10-CM | POA: Diagnosis not present

## 2016-04-03 DIAGNOSIS — T402X5A Adverse effect of other opioids, initial encounter: Secondary | ICD-10-CM | POA: Diagnosis not present

## 2016-04-03 DIAGNOSIS — Z95828 Presence of other vascular implants and grafts: Secondary | ICD-10-CM

## 2016-04-03 LAB — COMPREHENSIVE METABOLIC PANEL
ALBUMIN: 3.7 g/dL (ref 3.5–5.0)
ALT: 22 U/L (ref 14–54)
ANION GAP: 8 (ref 5–15)
AST: 27 U/L (ref 15–41)
Alkaline Phosphatase: 114 U/L (ref 38–126)
BUN: 23 mg/dL — ABNORMAL HIGH (ref 6–20)
CHLORIDE: 97 mmol/L — AB (ref 101–111)
CO2: 27 mmol/L (ref 22–32)
Calcium: 9.3 mg/dL (ref 8.9–10.3)
Creatinine, Ser: 1.36 mg/dL — ABNORMAL HIGH (ref 0.44–1.00)
GFR calc non Af Amer: 33 mL/min — ABNORMAL LOW (ref >60)
GFR, EST AFRICAN AMERICAN: 38 mL/min — AB (ref >60)
Glucose, Bld: 277 mg/dL — ABNORMAL HIGH (ref 65–99)
Potassium: 4.1 mmol/L (ref 3.5–5.1)
SODIUM: 132 mmol/L — AB (ref 135–145)
Total Bilirubin: 0.5 mg/dL (ref 0.3–1.2)
Total Protein: 6.6 g/dL (ref 6.5–8.1)

## 2016-04-03 LAB — URINALYSIS COMPLETE WITH MICROSCOPIC (ARMC ONLY)
BILIRUBIN URINE: NEGATIVE
Bacteria, UA: NONE SEEN
Glucose, UA: >500 mg/dL — AB
HGB URINE DIPSTICK: NEGATIVE
KETONES UR: NEGATIVE mg/dL
Leukocytes, UA: NEGATIVE
NITRITE: NEGATIVE
PH: 5 (ref 5.0–8.0)
Protein, ur: NEGATIVE mg/dL
RBC / HPF: NONE SEEN RBC/hpf (ref 0–5)
SPECIFIC GRAVITY, URINE: 1.009 (ref 1.005–1.030)
Squamous Epithelial / LPF: NONE SEEN

## 2016-04-03 LAB — CBC WITH DIFFERENTIAL/PLATELET
Basophils Absolute: 0.1 10*3/uL (ref 0–0.1)
Eosinophils Absolute: 0.5 10*3/uL (ref 0–0.7)
Eosinophils Relative: 5 %
HEMATOCRIT: 36.5 % (ref 35.0–47.0)
HEMOGLOBIN: 12.2 g/dL (ref 12.0–16.0)
LYMPHS ABS: 2.4 10*3/uL (ref 1.0–3.6)
MCH: 29.8 pg (ref 26.0–34.0)
MCHC: 33.4 g/dL (ref 32.0–36.0)
MCV: 89.1 fL (ref 80.0–100.0)
Monocytes Absolute: 1 10*3/uL — ABNORMAL HIGH (ref 0.2–0.9)
NEUTROS ABS: 8 10*3/uL — AB (ref 1.4–6.5)
Platelets: 186 10*3/uL (ref 150–440)
RBC: 4.1 MIL/uL (ref 3.80–5.20)
RDW: 12.9 % (ref 11.5–14.5)
WBC: 12 10*3/uL — ABNORMAL HIGH (ref 3.6–11.0)

## 2016-04-03 LAB — PROTIME-INR
INR: 1.01
INR: 1.1
Prothrombin Time: 13.5 seconds (ref 11.4–15.0)
Prothrombin Time: 14.4 seconds (ref 11.4–15.0)

## 2016-04-03 LAB — GLUCOSE, CAPILLARY: Glucose-Capillary: 226 mg/dL — ABNORMAL HIGH (ref 65–99)

## 2016-04-03 LAB — APTT: aPTT: 33 seconds (ref 24–36)

## 2016-04-03 LAB — TYPE AND SCREEN
ABO/RH(D): O NEG
Antibody Screen: NEGATIVE

## 2016-04-03 MED ORDER — ONDANSETRON HCL 4 MG/2ML IJ SOLN: 4.0000 mg | Freq: Four times a day (QID) | INTRAMUSCULAR | Status: DC | PRN

## 2016-04-03 MED ORDER — SODIUM CHLORIDE 0.9 % IV BOLUS (SEPSIS)
500.0000 mL | Freq: Once | INTRAVENOUS | Status: AC
  Administered 2016-04-03: 500 mL via INTRAVENOUS

## 2016-04-03 MED ORDER — INSULIN ASPART 100 UNIT/ML ~~LOC~~ SOLN: 0.0000 [IU] | Freq: Three times a day (TID) | SUBCUTANEOUS | Status: DC

## 2016-04-03 MED ORDER — BISACODYL 5 MG PO TBEC
5.0000 mg | DELAYED_RELEASE_TABLET | Freq: Every day | ORAL | Status: DC | PRN
  Administered 2016-04-06: 5 mg via ORAL
  Filled 2016-04-03: qty 1

## 2016-04-03 MED ORDER — ACETAMINOPHEN 650 MG RE SUPP: 650.0000 mg | Freq: Four times a day (QID) | RECTAL | Status: DC | PRN

## 2016-04-03 MED ORDER — METFORMIN HCL 500 MG PO TABS: 500.0000 mg | ORAL_TABLET | Freq: Every day | ORAL | Status: DC

## 2016-04-03 MED ORDER — ONDANSETRON HCL 4 MG PO TABS: 4.0000 mg | ORAL_TABLET | Freq: Four times a day (QID) | ORAL | Status: DC | PRN

## 2016-04-03 MED ORDER — MORPHINE SULFATE (PF) 2 MG/ML IV SOLN
1.0000 mg | INTRAVENOUS | Status: DC | PRN
  Administered 2016-04-03 – 2016-04-04 (×2): 1 mg via INTRAVENOUS
  Filled 2016-04-03 (×2): qty 1

## 2016-04-03 MED ORDER — TRAMADOL HCL 50 MG PO TABS
50.0000 mg | ORAL_TABLET | ORAL | Status: DC | PRN
  Administered 2016-04-03: 50 mg via ORAL
  Filled 2016-04-03: qty 1

## 2016-04-03 MED ORDER — ZOLPIDEM TARTRATE 5 MG PO TABS
5.0000 mg | ORAL_TABLET | Freq: Every day | ORAL | Status: DC
  Administered 2016-04-04 – 2016-04-06 (×2): 5 mg via ORAL
  Filled 2016-04-03 (×2): qty 1

## 2016-04-03 MED ORDER — DOCUSATE SODIUM 100 MG PO CAPS
100.0000 mg | ORAL_CAPSULE | Freq: Two times a day (BID) | ORAL | Status: DC
  Administered 2016-04-04 – 2016-04-05 (×2): 100 mg via ORAL
  Filled 2016-04-03 (×5): qty 1

## 2016-04-03 MED ORDER — HYDROCODONE-ACETAMINOPHEN 5-325 MG PO TABS: 1.0000 | ORAL_TABLET | ORAL | Status: DC | PRN

## 2016-04-03 MED ORDER — ONDANSETRON HCL 4 MG/2ML IJ SOLN
4.0000 mg | Freq: Once | INTRAMUSCULAR | Status: AC
  Administered 2016-04-03: 4 mg via INTRAVENOUS
  Filled 2016-04-03: qty 2

## 2016-04-03 MED ORDER — SODIUM CHLORIDE 0.9 % IV SOLN
INTRAVENOUS | Status: DC
Start: 2016-04-03 — End: 2016-04-04
  Administered 2016-04-03 – 2016-04-04 (×2): via INTRAVENOUS
  Administered 2016-04-04: 75 mL/h via INTRAVENOUS

## 2016-04-03 MED ORDER — MORPHINE SULFATE (PF) 4 MG/ML IV SOLN
4.0000 mg | Freq: Once | INTRAVENOUS | Status: AC
  Administered 2016-04-03: 4 mg via INTRAVENOUS
  Filled 2016-04-03: qty 1

## 2016-04-03 MED ORDER — INSULIN ASPART 100 UNIT/ML ~~LOC~~ SOLN
0.0000 [IU] | Freq: Three times a day (TID) | SUBCUTANEOUS | Status: DC
  Administered 2016-04-03 – 2016-04-06 (×6): 5 [IU] via SUBCUTANEOUS
  Administered 2016-04-06: 11 [IU] via SUBCUTANEOUS
  Administered 2016-04-06: 2 [IU] via SUBCUTANEOUS
  Administered 2016-04-07: 11 [IU] via SUBCUTANEOUS
  Administered 2016-04-07: 8 [IU] via SUBCUTANEOUS
  Filled 2016-04-03: qty 5
  Filled 2016-04-03: qty 8
  Filled 2016-04-03: qty 11
  Filled 2016-04-03: qty 2
  Filled 2016-04-03 (×6): qty 5
  Filled 2016-04-03: qty 11

## 2016-04-03 MED ORDER — GLIMEPIRIDE 2 MG PO TABS
4.0000 mg | ORAL_TABLET | Freq: Every day | ORAL | Status: DC
  Administered 2016-04-05 – 2016-04-07 (×3): 4 mg via ORAL
  Filled 2016-04-03 (×2): qty 2

## 2016-04-03 MED ORDER — ACETAMINOPHEN 325 MG PO TABS: 650.0000 mg | ORAL_TABLET | Freq: Four times a day (QID) | ORAL | Status: DC | PRN

## 2016-04-03 MED ORDER — PREGABALIN 50 MG PO CAPS
50.0000 mg | ORAL_CAPSULE | Freq: Two times a day (BID) | ORAL | Status: DC
  Administered 2016-04-04 – 2016-04-07 (×6): 50 mg via ORAL
  Filled 2016-04-03 (×5): qty 1

## 2016-04-03 MED ORDER — IRBESARTAN 75 MG PO TABS: 75.0000 mg | ORAL_TABLET | Freq: Every day | ORAL | Status: DC

## 2016-04-03 MED ORDER — CLINDAMYCIN PHOSPHATE 600 MG/50ML IV SOLN
600.0000 mg | Freq: Once | INTRAVENOUS | Status: AC
  Administered 2016-04-04: 600 mg via INTRAVENOUS
  Filled 2016-04-03: qty 50

## 2016-04-03 MED ORDER — LEVOTHYROXINE SODIUM 50 MCG PO TABS
100.0000 ug | ORAL_TABLET | Freq: Every day | ORAL | Status: DC
  Administered 2016-04-05 – 2016-04-07 (×2): 100 ug via ORAL
  Filled 2016-04-03 (×3): qty 2

## 2016-04-03 MED ORDER — CLINDAMYCIN PHOSPHATE 600 MG/50ML IV SOLN: 600.0000 mg | Freq: Once | INTRAVENOUS | Status: DC

## 2016-04-03 MED ORDER — CETYLPYRIDINIUM CHLORIDE 0.05 % MT LIQD
7.0000 mL | Freq: Two times a day (BID) | OROMUCOSAL | Status: DC
  Administered 2016-04-04 – 2016-04-07 (×6): 7 mL via OROMUCOSAL

## 2016-04-03 NOTE — ED Notes (Signed)
md in with pt again  

## 2016-04-03 NOTE — ED Notes (Signed)
Resumed care from [place] rn.  Pt in xray.  Family in room

## 2016-04-03 NOTE — ED Provider Notes (Signed)
Patient had brief desaturation after morphine to 80%, started on 2 L nasal cannula with improvement to 98%  Jene Every, MD 04/03/16 1544

## 2016-04-03 NOTE — ED Notes (Signed)
Dr Cyril Loosen in with pt. And family  Pain meds given.

## 2016-04-03 NOTE — Progress Notes (Signed)
Patient's Bp is 88/40. She is alert and oriented x 4. Denies lightheadedness, dizziness, SOB, and CP. MD notified; bolus of normal saline ordered.

## 2016-04-03 NOTE — ED Provider Notes (Signed)
[facility] Emergency Department Provider Note  ____________________________________________    I have reviewed the triage vital signs and the nursing notes.   HISTORY  Chief Complaint Fall    HPI Sierra Warren is a 82 y.o. female who presents after a fall. She reports she lost her balance carrying trash outside and fell onto her right hip.She complains of severe pain in the right hip. She denies other injuries. No abdominal pain no chest pain or shortness of breath. She did not hit her head     Past Medical History  Diagnosis Date  . Diabetes mellitus without complication (HCC)   . Thyroid disease   . Hypertension     There are no active problems to display for this patient.   History reviewed. No pertinent past surgical history.  No current outpatient prescriptions on file.  Allergies Penicillins  No family history on file.  Social History Social History  Substance Use Topics  . Smoking status: Never Smoker   . Smokeless tobacco: None  . Alcohol Use: No    Review of Systems  Constitutional: Negative for fever. Eyes: Negative for redness ENT: Negative for Neck pain Cardiovascular: Negative for chest pain Respiratory: Negative for shortness of breath. Gastrointestinal: Negative for abdominal pain Genitourinary: Negative for dysuria. Musculoskeletal: Negative for back pain. Positive for right hip pain Skin: Negative for rash. Neurological: Negative for focal weakness, no headache Psychiatric: no anxiety    ____________________________________________   PHYSICAL EXAM:  VITAL SIGNS: ED Triage Vitals  Enc Vitals Group     BP 04/03/16 1348 152/94 mmHg     Pulse Rate 04/03/16 1348 88     Resp 04/03/16 1348 18     Temp 04/03/16 1348 97.4 F (36.3 C)     Temp Source 04/03/16 1348 Oral     SpO2 04/03/16 1348 92 %     Weight 04/03/16 1348 160 lb (72.576 kg)     Height 04/03/16 1348 5\' 3"  (1.6 m)     Head Cir --      Peak  Flow --      Pain Score 04/03/16 1350 10     Pain Loc --      Pain Edu? --      Excl. in GC? --      Constitutional: Alert and oriented. Eyes: Conjunctivae are normal. No erythema or injection ENT   Head: Normocephalic and atraumatic.   Mouth/Throat: Mucous membranes are moist. Cardiovascular: Normal rate, regular rhythm. Normal and symmetric distal pulses are present in the upper extremities.   Respiratory: Normal respiratory effort without tachypnea nor retractions. Breath sounds are clear and equal bilaterally.  Gastrointestinal: Soft and non-tender in all quadrants. No distention. There is no CVA tenderness. Genitourinary: deferred Musculoskeletal: Patient has tenderness to palpation of the right hip. She is unable to move the right leg because of pain of the right hip. No left hip tenderness palpation, knee is normal and no swelling Neurologic:  Normal speech and language. No gross focal neurologic deficits are appreciated. Skin:  Skin is warm, dry and intact. No rash noted. Psychiatric: Mood and affect are normal. Patient exhibits appropriate insight and judgment.  ____________________________________________    LABS (pertinent positives/negatives)  Labs Reviewed  CBC WITH DIFFERENTIAL/PLATELET - Abnormal; Notable for the following:    WBC 12.0 (*)    Neutro Abs 8.0 (*)    Monocytes Absolute 1.0 (*)    All other components within normal limits  COMPREHENSIVE METABOLIC PANEL - Abnormal;  Notable for the following:    Sodium 132 (*)    Chloride 97 (*)    Glucose, Bld 277 (*)    BUN 23 (*)    Creatinine, Ser 1.36 (*)    GFR calc non Af Amer 33 (*)    GFR calc Af Amer 38 (*)    All other components within normal limits  URINALYSIS COMPLETEWITH MICROSCOPIC ([facility] ONLY) - Abnormal; Notable for the following:    Color, Urine YELLOW (*)    APPearance CLEAR (*)    Glucose, UA >500 (*)    All other components within normal limits  PROTIME-INR  APTT  TYPE AND SCREEN   ABO/RH    ____________________________________________   EKG  None  ____________________________________________    RADIOLOGY  Right intertrochanteric hip fracture  ____________________________________________   PROCEDURES  Procedure(s) performed: none  Critical Care performed:none  ____________________________________________   INITIAL IMPRESSION / ASSESSMENT AND PLAN / ED COURSE  Pertinent labs & imaging results that were available during my care of the patient were reviewed by me and considered in my medical decision making (see chart for details).  Patient is with right hip pain status post fall. Concerning for hip fracture. We will obtain x-rays, labs, give pain medication and reevaluate  ----------------------------------------- 3:14 PM on 04/03/2016 -----------------------------------------  Patient with right hip fracture, consulted Dr. Darleen Crocker, will admit to the hospitalist service  ____________________________________________   FINAL CLINICAL IMPRESSION(S) / ED DIAGNOSES  Final diagnoses:  Intertrochanteric fracture of right hip, closed, initial encounter [facility])          Jene Every, MD 04/03/16 [phone]

## 2016-04-03 NOTE — ED Notes (Signed)
Pt arrived via EMS from home. Pt states tripped in carport carrying trash outside and fell onto concrete. Pt denies LOC. Pt reports right hip and leg pain. EMS reports no shortening, no rotation and no crepitus. EMS reports 160/80, 90 HR, 98% RA. EMS attempted IV multiple times without success. EMS administered fentanyl 100 mcg IM per Dr. Lenard Lance verbal order.

## 2016-04-03 NOTE — ED Notes (Signed)
Pt placed on 2 liters oxygen after meds given.  Pt sleeping now.  Family with pt

## 2016-04-03 NOTE — Consult Note (Signed)
ORTHOPAEDIC CONSULTATION  REQUESTING PHYSICIAN: Enedina Finner, MD  Chief Complaint:   Right hip pain.  History of Present Illness: Sierra Warren is a 82 y.o. female with a history of diabetes, hypertension, and hypothyroidism who lives independently. Apparently she went out to retrieve her empty can. She was rolling it up the driveway, she lost her balance and fell onto her right hip. She was unable to get up so EMS services were called. She was brought to the emergency room where x-rays demonstrated a two-part intertrochanteric fracture of her right hip. She is being admitted at this time to the hospitalist service for medical stabilization in preparation for definitive management of her hip injury. The patient denies any associated injuries as a result of the fall. She did not strike her head or lose consciousness. The patient also denies any lightheadedness, dizziness, shortness breath, or other symptoms that may have precipitated her fall.  Past Medical History  Diagnosis Date  . Diabetes mellitus without complication (HCC)   . Thyroid disease   . Hypertension    History reviewed. No pertinent past surgical history. Social History   Social History  . Marital Status: Widowed    Spouse Name: N/A  . Number of Children: N/A  . Years of Education: N/A   Social History Main Topics  . Smoking status: Never Smoker   . Smokeless tobacco: None  . Alcohol Use: No  . Drug Use: No  . Sexual Activity: Not Asked   Other Topics Concern  . None   Social History Narrative   No family history on file. Allergies  Allergen Reactions  . Penicillins Swelling   Prior to Admission medications   Medication Sig Start Date End Date Taking? Authorizing Provider  ALPRAZolam Prudy Feeler) 0.5 MG tablet Take 1 tablet by mouth 2 (two) times daily as needed. 03/04/16  Yes Historical Provider, MD  aspirin EC 81 MG tablet Take 1 tablet by mouth  every other day. 07/27/12  Yes Historical Provider, MD  glimepiride (AMARYL) 2 MG tablet Take 2 tablets by mouth daily. 03/04/16  Yes Historical Provider, MD  levothyroxine (SYNTHROID, LEVOTHROID) 100 MCG tablet Take 1 tablet by mouth daily. 03/04/16  Yes Historical Provider, MD  LYRICA 50 MG capsule Take 1 capsule by mouth 2 (two) times daily. 03/14/16  Yes Historical Provider, MD  metFORMIN (GLUCOPHAGE) 500 MG tablet Take 1 tablet by mouth daily. 02/12/16  Yes Historical Provider, MD  valsartan-hydrochlorothiazide (DIOVAN-HCT) 80-12.5 MG tablet Take 1 tablet by mouth daily. 03/04/16  Yes Historical Provider, MD  zolpidem (AMBIEN) 10 MG tablet Take 1 tablet by mouth at bedtime. 02/14/16  Yes Historical Provider, MD   Dg Chest 1 View  04/03/2016  CLINICAL DATA:  82 year old female with a history of right hip pain EXAM: CHEST 1 VIEW COMPARISON:  11/26/2015 FINDINGS: Cardiomediastinal silhouette unchanged in size and contour. No evidence of central vascular congestion. No confluent airspace disease or pneumothorax.  No pleural effusion. No displaced fracture.  Healed left-sided rib fractures. Surgical changes of the right upper abdomen. IMPRESSION: Negative for acute cardiopulmonary disease. Signed, Yvone Neu. Loreta Ave, DO [facility] Electronically Signed   By: Gilmer Mor D.O.   On: 04/03/2016 15:23   Dg Hip Unilat With Pelvis 2-3 Views Right  04/03/2016  CLINICAL DATA:  Status post fall today with onset of right hip pain. No known injury. Initial encounter. EXAM: DG HIP (WITH OR WITHOUT PELVIS) 2-3V RIGHT COMPARISON:  None. FINDINGS: The patient has  an acute right intertrochanteric fracture. No other acute bony or joint abnormality is seen. Bones are osteopenic. Bilateral hip degenerative changes seen. IMPRESSION: Acute right intertrochanteric fracture. Osteopenia. Bilateral hip osteoarthritis. Electronically Signed   By: Drusilla Kanner M.D.   On:  04/03/2016 15:14    Positive ROS: All other systems have been reviewed and were otherwise negative with the exception of those mentioned in the HPI and as above.  Physical Exam: General:  Alert, no acute distress Psychiatric:  Patient is competent for consent with normal mood and affect   Cardiovascular:  No pedal edema Respiratory:  No wheezing, non-labored breathing GI:  Abdomen is soft and non-tender Skin:  No lesions in the area of chief complaint Neurologic:  Sensation intact distally Lymphatic:  No axillary or cervical lymphadenopathy  Orthopedic Exam:  Orthopedic examination is limited to the right hip and lower extremity. The patient's right lower extremity is somewhat shortened and externally rotated as compared to the left. She has pain with any attempted active or passive motion of the hip. Skin inspection around the hip is unremarkable. She has mild-moderate tenderness to palpation over the trochanteric region. She is able to actively dorsiflex and plantarflex her toes and ankle. Sensation is intact to light touch to all distributions of her right lower extremity. She has good capillary refill to her right foot.  X-rays:  X-rays of the pelvis and right hip are available for review. These films demonstrate a mildly displaced two-part intertrochanteric fracture. Mild degenerative changes of the hip are noted with overall satisfactory maintenance of the clear space. No other abnormalities are identified.  Assessment: Displaced two-part intertrochanteric fracture, right hip.  Plan: The treatment options are discussed with the patient and her family. The patient and her family would like to proceed with a reduction and internal fixation of the right hip fracture using a trochanteric intramedullary nail. This procedure has been discussed in detail with the patient and her family, as have the potential risks (including bleeding, infection, nerve and/or blood vessel injury, persistent or  recurrent pain, stiffness, malunion and/or nonunion, leg length inequality, need for further surgery, blood clots, strokes, heart attacks and/or arrhythmias, etc.) and benefits. The patient states her understanding and wishes to proceed. A formal written consent will be obtained by the nursing staff.  Thank you for ask me to participate in the care of this most pleasant woman. I will be happy to follow her with you.   Maryagnes Amos, MD  Beeper #:  [phone]  04/03/2016 5:41 PM

## 2016-04-03 NOTE — H&P (Signed)
[facility] [place] [facility]   PATIENT NAME: Sierra Warren    MR#:  [id number]  DATE OF BIRTH:  10/18/1921  DATE OF ADMISSION:  04/03/2016  PRIMARY CARE PHYSICIAN: Danella Penton, MD   REQUESTING/REFERRING PHYSICIAN: Dr. Derrill Kay  CHIEF COMPLAINT:  Fall today comes in with right hip pain  HISTORY OF PRESENT ILLNESS:  Sierra Warren  is a 82 y.o. female with a known history of Hypertension, diabetes, hypothyroidism comes to the emergency room after she had a mechanical fall while dragging the trash cane on her concrete driveway. Patient started having pain over the right hip found to have right intertrochanteric hip fracture. Patient denies any chest pain shortness of breath. She has good functional capacity at home according to the family. She does not use walker or cane which she is supposed to follow while ambulating. Denies any lightheadedness dizziness could've precipitated the fall. Patient of family denies any cardiac history  PAST MEDICAL HISTORY:   Past Medical History  Diagnosis Date  . Diabetes mellitus without complication (HCC)   . Thyroid disease   . Hypertension     PAST SURGICAL HISTOIRY:  History reviewed. No pertinent past surgical history.  SOCIAL HISTORY:   Social History  Substance Use Topics  . Smoking status: Never Smoker   . Smokeless tobacco: Not on file  . Alcohol Use: No    FAMILY HISTORY:  HTn  DRUG ALLERGIES:   Allergies  Allergen Reactions  . Codeine Other (See Comments)    Upset stomach, diarrhea  . Penicillins Swelling    REVIEW OF SYSTEMS:  Review of Systems  Constitutional: Negative for fever, chills and weight loss.  HENT: Negative for ear discharge, ear pain and nosebleeds.   Eyes: Negative for blurred vision, pain and discharge.  Respiratory: Negative for sputum production, shortness of breath, wheezing and stridor.   Cardiovascular: Negative for chest pain, palpitations, orthopnea and PND.   Gastrointestinal: Negative for nausea, vomiting, abdominal pain and diarrhea.  Genitourinary: Negative for urgency and frequency.  Musculoskeletal: Positive for joint pain and falls. Negative for back pain.  Neurological: Negative for sensory change, speech change, focal weakness and weakness.  Psychiatric/Behavioral: Negative for depression and hallucinations. The patient is not nervous/anxious.   All other systems reviewed and are negative.    MEDICATIONS AT HOME:   Prior to Admission medications   Medication Sig Start Date End Date Taking? Authorizing Provider  ALPRAZolam Prudy Feeler) 0.5 MG tablet Take 1 tablet by mouth 2 (two) times daily as needed. 03/04/16  Yes Historical Provider, MD  aspirin EC 81 MG tablet Take 1 tablet by mouth every other day. 07/27/12  Yes Historical Provider, MD  glimepiride (AMARYL) 2 MG tablet Take 2 tablets by mouth daily. 03/04/16  Yes Historical Provider, MD  levothyroxine (SYNTHROID, LEVOTHROID) 100 MCG tablet Take 1 tablet by mouth daily. 03/04/16  Yes Historical Provider, MD  LYRICA 50 MG capsule Take 1 capsule by mouth 2 (two) times daily. 03/14/16  Yes Historical Provider, MD  metFORMIN (GLUCOPHAGE) 500 MG tablet Take 1 tablet by mouth daily. 02/12/16  Yes Historical Provider, MD  valsartan-hydrochlorothiazide (DIOVAN-HCT) 80-12.5 MG tablet Take 1 tablet by mouth daily. 03/04/16  Yes Historical Provider, MD  zolpidem (AMBIEN) 10 MG tablet Take 1 tablet by mouth at bedtime. 02/14/16  Yes Historical Provider, MD      VITAL SIGNS:  Blood pressure 107/56, pulse 73, temperature 97.7 F (36.5 C), temperature source Oral, resp. rate 17, height 5\' 3"  (  1.6 m), weight 78.064 kg (172 lb 1.6 oz), SpO2 100 %.  PHYSICAL EXAMINATION:  GENERAL:  82 y.o.-year-old patient lying in the bed with no acute distress.  EYES: Pupils equal, round, reactive to light and accommodation. No scleral icterus. Extraocular muscles intact.  HEENT: Head atraumatic, normocephalic. Oropharynx  and nasopharynx clear.  NECK:  Supple, no jugular venous distention. No thyroid enlargement, no tenderness.  LUNGS: Normal breath sounds bilaterally, no wheezing, rales,rhonchi or crepitation. No use of accessory muscles of respiration.  CARDIOVASCULAR: S1, S2 normal. No murmurs, rubs, or gallops.  ABDOMEN: Soft, nontender, nondistended. Bowel sounds present. No organomegaly or mass.  EXTREMITIES: No pedal edema, cyanosis, or clubbing.  NEUROLOGIC: Cranial nerves II through XII are intact. Muscle strength 5/5 in all extremities. Sensation intact. Gait not checked.  PSYCHIATRIC: The patient is alert and oriented x 3.  SKIN: No obvious rash, lesion, or ulcer.   LABORATORY PANEL:   CBC  Recent Labs Lab 04/03/16 1419  WBC 12.0*  HGB 12.2  HCT 36.5  PLT 186   ------------------------------------------------------------------------------------------------------------------  Chemistries   Recent Labs Lab 04/03/16 1419  NA 132*  K 4.1  CL 97*  CO2 27  GLUCOSE 277*  BUN 23*  CREATININE 1.36*  CALCIUM 9.3  AST 27  ALT 22  ALKPHOS 114  BILITOT 0.5   ------------------------------------------------------------------------------------------------------------------  Cardiac Enzymes No results for input(s): TROPONINI in the last 168 hours. ------------------------------------------------------------------------------------------------------------------  RADIOLOGY:  Dg Chest 1 View  04/03/2016  CLINICAL DATA:  82 year old female with a history of right hip pain EXAM: CHEST 1 VIEW COMPARISON:  11/26/2015 FINDINGS: Cardiomediastinal silhouette unchanged in size and contour. No evidence of central vascular congestion. No confluent airspace disease or pneumothorax.  No pleural effusion. No displaced fracture.  Healed left-sided rib fractures. Surgical changes of the right upper abdomen. IMPRESSION: Negative for acute cardiopulmonary disease. Signed, Yvone Neu. Loreta Ave, DO [facility] Electronically Signed   By: Gilmer Mor D.O.   On: 04/03/2016 15:23   Dg Hip Unilat With Pelvis 2-3 Views Right  04/03/2016  CLINICAL DATA:  Status post fall today with onset of right hip pain. No known injury. Initial encounter. EXAM: DG HIP (WITH OR WITHOUT PELVIS) 2-3V RIGHT COMPARISON:  None. FINDINGS: The patient has an acute right intertrochanteric fracture. No other acute bony or joint abnormality is seen. Bones are osteopenic. Bilateral hip degenerative changes seen. IMPRESSION: Acute right intertrochanteric fracture. Osteopenia. Bilateral hip osteoarthritis. Electronically Signed   By: Drusilla Kanner M.D.   On: 04/03/2016 15:14    EKG:  Normal sinus rhythm  IMPRESSION AND PLAN:  Sierra Warren  is a 82 y.o. female with a known history of Hypertension, diabetes, hypothyroidism comes to the emergency room after she had a mechanical fall while dragging the trash cane on her concrete driveway. Patient started having pain over the right hip found to have right intertrochanteric hip fracture.  1. Right intertrochanteric hip fracture after mechanical fall at home -Admitted to orthopedic floor -Seen by Dr. Joice Lofts -Patient is at a low to intermediate risk for surgery. -EKG normal sinus rhythm.  -She has no cardiac history  2. Type 2 diabetes -Sliding-scale insulin -Resume home meds after surgery  3. Hypothyroidism continue Synthroid  4. DVT prophylaxis will hold off on Lovenox given anticipated surgery in a.m.  5. Hyponatremia mild I will hold off on hydrochlorthiazide  6. Hypertension continue Valsartan  All the records are reviewed and case discussed with ED provider. Management plans  discussed with the patient, family and they are in agreement.  CODE STATUS: Full  TOTAL TIME TAKING CARE OF THIS PATIENT: 50 minutes.    Hitesh Fouche M.D on 04/03/2016 at 6:07 PM  Between 7am to 6pm - Pager - [phone]  After 6pm go  to [email or web address] - password [email or web address] [facility]  [facility]  Office  [phone]  CC: Primary care physician; Danella Penton, MD

## 2016-04-03 NOTE — Progress Notes (Signed)
Patient transferred to floor from ED. Patient is alert and oriented complaining of pain to the R hip. In ED her BP dropped once she received IV morphine, offered her Vicodin as it is ordered in her MAR, but pt states she has an intolerance to codeine and would like to try something else. Spoke to Dr. Joice Lofts and he gave me verbal order to try PO tramadol 50mg  Q4 prn for moderate pain. He also said it would be okay to try IV morphine again for severe pain as it is only ordered as a 1 mg dose vs the 4 mg dose she received in the ED. VS: BP 107/56 mmHg  Pulse 73  Temp(Src) 97.7 F (36.5 C) (Oral)  Resp 17  Ht 5\' 3"  (1.6 m)  Wt 78.064 kg (172 lb 1.6 oz)  BMI 30.49 kg/m2  SpO2 100%. Family is with patient at bedside.

## 2016-04-03 NOTE — Progress Notes (Signed)
Patient's clindamycin is ordered to be given during pre-op phase. So it does not fall off of work-list, due time changed to 1000 on 5/19. Next shift notified so that medication can be sent down to pre-op with patient before surgery on 5/19.

## 2016-04-04 ENCOUNTER — Inpatient Hospital Stay: Payer: PPO | Admitting: Anesthesiology

## 2016-04-04 ENCOUNTER — Encounter: Payer: Self-pay | Admitting: *Deleted

## 2016-04-04 ENCOUNTER — Inpatient Hospital Stay: Payer: PPO

## 2016-04-04 ENCOUNTER — Encounter
Admission: EM | Disposition: A | Discharge status: Skilled Nursing Facility | Payer: Self-pay | Source: Home / Self Care | Attending: Internal Medicine

## 2016-04-04 HISTORY — PX: INTRAMEDULLARY (IM) NAIL INTERTROCHANTERIC: SHX5875

## 2016-04-04 LAB — CBC
HCT: 30.5 % — ABNORMAL LOW (ref 35.0–47.0)
Hemoglobin: 10.2 g/dL — ABNORMAL LOW (ref 12.0–16.0)
MCH: 30.3 pg (ref 26.0–34.0)
MCHC: 33.5 g/dL (ref 32.0–36.0)
MCV: 90.5 fL (ref 80.0–100.0)
PLATELETS: 188 10*3/uL (ref 150–440)
RBC: 3.37 MIL/uL — AB (ref 3.80–5.20)
RDW: 13.1 % (ref 11.5–14.5)
WBC: 11.6 10*3/uL — AB (ref 3.6–11.0)

## 2016-04-04 LAB — BASIC METABOLIC PANEL
ANION GAP: 6 (ref 5–15)
BUN: 28 mg/dL — ABNORMAL HIGH (ref 6–20)
CALCIUM: 8.5 mg/dL — AB (ref 8.9–10.3)
CO2: 27 mmol/L (ref 22–32)
Chloride: 102 mmol/L (ref 101–111)
Creatinine, Ser: 1.97 mg/dL — ABNORMAL HIGH (ref 0.44–1.00)
GFR, EST AFRICAN AMERICAN: 24 mL/min — AB (ref >60)
GFR, EST NON AFRICAN AMERICAN: 21 mL/min — AB (ref >60)
GLUCOSE: 211 mg/dL — AB (ref 65–99)
POTASSIUM: 4.6 mmol/L (ref 3.5–5.1)
SODIUM: 135 mmol/L (ref 135–145)

## 2016-04-04 LAB — GLUCOSE, CAPILLARY
GLUCOSE-CAPILLARY: 204 mg/dL — AB (ref 65–99)
GLUCOSE-CAPILLARY: 135 mg/dL — AB (ref 65–99)
GLUCOSE-CAPILLARY: 165 mg/dL — AB (ref 65–99)
Glucose-Capillary: 123 mg/dL — ABNORMAL HIGH (ref 65–99)
Glucose-Capillary: 139 mg/dL — ABNORMAL HIGH (ref 65–99)

## 2016-04-04 LAB — SURGICAL PCR SCREEN
MRSA, PCR: NEGATIVE
STAPHYLOCOCCUS AUREUS: NEGATIVE

## 2016-04-04 LAB — TROPONIN I: Troponin I: <0.03 ng/mL (ref <0.031)

## 2016-04-04 LAB — ABO/RH: ABO/RH(D): O NEG

## 2016-04-04 SURGERY — FIXATION, FRACTURE, INTERTROCHANTERIC, WITH INTRAMEDULLARY ROD
Anesthesia: General | Site: Hip | Laterality: Right | Wound class: Clean

## 2016-04-04 MED ORDER — PROPOFOL 10 MG/ML IV BOLUS
INTRAVENOUS | Status: DC | PRN
  Administered 2016-04-04: 50 mg via INTRAVENOUS
  Administered 2016-04-04: 20 mg via INTRAVENOUS

## 2016-04-04 MED ORDER — SODIUM CHLORIDE 0.9 % IV SOLN: INTRAVENOUS | Status: DC

## 2016-04-04 MED ORDER — SODIUM CHLORIDE 0.9 % IV BOLUS (SEPSIS)
500.0000 mL | Freq: Once | INTRAVENOUS | Status: AC
  Administered 2016-04-04: 500 mL via INTRAVENOUS

## 2016-04-04 MED ORDER — SUCCINYLCHOLINE CHLORIDE 20 MG/ML IJ SOLN
INTRAMUSCULAR | Status: DC | PRN
  Administered 2016-04-04: 60 mg via INTRAVENOUS

## 2016-04-04 MED ORDER — SUGAMMADEX SODIUM 200 MG/2ML IV SOLN
INTRAVENOUS | Status: DC | PRN
  Administered 2016-04-04: 160 mg via INTRAVENOUS

## 2016-04-04 MED ORDER — FENTANYL CITRATE (PF) 100 MCG/2ML IJ SOLN
INTRAMUSCULAR | Status: DC | PRN
  Administered 2016-04-04: 25 ug via INTRAVENOUS
  Administered 2016-04-04: 50 ug via INTRAVENOUS
  Administered 2016-04-04: 25 ug via INTRAVENOUS

## 2016-04-04 MED ORDER — ACETAMINOPHEN 325 MG PO TABS
650.0000 mg | ORAL_TABLET | Freq: Four times a day (QID) | ORAL | Status: DC | PRN
  Filled 2016-04-04: qty 2

## 2016-04-04 MED ORDER — CLINDAMYCIN PHOSPHATE 600 MG/50ML IV SOLN
600.0000 mg | Freq: Four times a day (QID) | INTRAVENOUS | Status: AC
  Administered 2016-04-04 – 2016-04-05 (×2): 600 mg via INTRAVENOUS
  Filled 2016-04-04 (×2): qty 50

## 2016-04-04 MED ORDER — OXYCODONE HCL 5 MG PO TABS
5.0000 mg | ORAL_TABLET | ORAL | Status: DC | PRN
  Administered 2016-04-05 – 2016-04-06 (×4): 5 mg via ORAL
  Filled 2016-04-04: qty 1
  Filled 2016-04-04: qty 2
  Filled 2016-04-04 (×2): qty 1

## 2016-04-04 MED ORDER — ACETAMINOPHEN 10 MG/ML IV SOLN
INTRAVENOUS | Status: DC | PRN
  Administered 2016-04-04: 1000 mg via INTRAVENOUS

## 2016-04-04 MED ORDER — NEOMYCIN-POLYMYXIN B GU 40-200000 IR SOLN
Status: AC
  Filled 2016-04-04: qty 2

## 2016-04-04 MED ORDER — BISACODYL 10 MG RE SUPP
10.0000 mg | Freq: Every day | RECTAL | Status: DC | PRN
  Administered 2016-04-07: 10 mg via RECTAL
  Filled 2016-04-04: qty 1

## 2016-04-04 MED ORDER — ACETAMINOPHEN 500 MG PO TABS
1000.0000 mg | ORAL_TABLET | Freq: Four times a day (QID) | ORAL | Status: AC
  Administered 2016-04-04 – 2016-04-05 (×3): 1000 mg via ORAL
  Filled 2016-04-04 (×3): qty 2

## 2016-04-04 MED ORDER — DIPHENHYDRAMINE HCL 12.5 MG/5ML PO ELIX
12.5000 mg | ORAL_SOLUTION | ORAL | Status: DC | PRN
  Filled 2016-04-04: qty 10

## 2016-04-04 MED ORDER — SODIUM CHLORIDE 0.9 % IV BOLUS (SEPSIS)
1000.0000 mL | Freq: Once | INTRAVENOUS | Status: AC
  Administered 2016-04-04: 1000 mL via INTRAVENOUS

## 2016-04-04 MED ORDER — ACETAMINOPHEN 650 MG RE SUPP
650.0000 mg | Freq: Four times a day (QID) | RECTAL | Status: DC | PRN
Start: 2016-04-04 — End: 2016-04-07

## 2016-04-04 MED ORDER — MIDAZOLAM HCL 2 MG/2ML IJ SOLN
INTRAMUSCULAR | Status: DC | PRN
  Administered 2016-04-04: 1 mg via INTRAVENOUS

## 2016-04-04 MED ORDER — ACETAMINOPHEN 10 MG/ML IV SOLN
INTRAVENOUS | Status: AC
  Filled 2016-04-04: qty 100

## 2016-04-04 MED ORDER — CEFAZOLIN SODIUM-DEXTROSE 2-4 GM/100ML-% IV SOLN
2.0000 g | Freq: Four times a day (QID) | INTRAVENOUS | Status: AC
  Administered 2016-04-04 – 2016-04-05 (×3): 2 g via INTRAVENOUS
  Filled 2016-04-04 (×3): qty 100

## 2016-04-04 MED ORDER — MAGNESIUM HYDROXIDE 400 MG/5ML PO SUSP
30.0000 mL | Freq: Every day | ORAL | Status: DC | PRN
  Administered 2016-04-06: 30 mL via ORAL
  Filled 2016-04-04: qty 30

## 2016-04-04 MED ORDER — HYDROMORPHONE HCL 1 MG/ML IJ SOLN: 0.2500 mg | INTRAMUSCULAR | Status: DC | PRN

## 2016-04-04 MED ORDER — PHENYLEPHRINE 8 MG IN D5W 100 ML (0.08MG/ML) PREMIX OPTIME
INJECTION | INTRAVENOUS | Status: DC | PRN
  Administered 2016-04-04: 20 ug/min via INTRAVENOUS

## 2016-04-04 MED ORDER — FENTANYL CITRATE (PF) 100 MCG/2ML IJ SOLN: 25.0000 ug | INTRAMUSCULAR | Status: DC | PRN

## 2016-04-04 MED ORDER — ENOXAPARIN SODIUM 30 MG/0.3ML ~~LOC~~ SOLN
30.0000 mg | SUBCUTANEOUS | Status: DC
Start: 2016-04-05 — End: 2016-04-07
  Administered 2016-04-05 – 2016-04-07 (×3): 30 mg via SUBCUTANEOUS
  Filled 2016-04-04 (×4): qty 0.3

## 2016-04-04 MED ORDER — DOCUSATE SODIUM 100 MG PO CAPS
100.0000 mg | ORAL_CAPSULE | Freq: Two times a day (BID) | ORAL | Status: DC
  Administered 2016-04-05 – 2016-04-07 (×5): 100 mg via ORAL
  Filled 2016-04-04 (×3): qty 1

## 2016-04-04 MED ORDER — NEOMYCIN-POLYMYXIN B GU 40-200000 IR SOLN
Status: DC | PRN
  Administered 2016-04-04: 2 mL

## 2016-04-04 MED ORDER — METOCLOPRAMIDE HCL 5 MG/ML IJ SOLN: 5.0000 mg | Freq: Three times a day (TID) | INTRAMUSCULAR | Status: DC | PRN

## 2016-04-04 MED ORDER — LIDOCAINE HCL (CARDIAC) 20 MG/ML IV SOLN
INTRAVENOUS | Status: DC | PRN
  Administered 2016-04-04: 80 mg via INTRAVENOUS

## 2016-04-04 MED ORDER — BUPIVACAINE-EPINEPHRINE (PF) 0.5% -1:200000 IJ SOLN
INTRAMUSCULAR | Status: AC
  Filled 2016-04-04: qty 30

## 2016-04-04 MED ORDER — BUPIVACAINE-EPINEPHRINE (PF) 0.5% -1:200000 IJ SOLN
INTRAMUSCULAR | Status: DC | PRN
  Administered 2016-04-04: 30 mL

## 2016-04-04 MED ORDER — POTASSIUM CHLORIDE IN NACL 20-0.9 MEQ/L-% IV SOLN
INTRAVENOUS | Status: DC
  Administered 2016-04-04: 21:00:00 via INTRAVENOUS
  Filled 2016-04-04 (×5): qty 1000

## 2016-04-04 MED ORDER — FENTANYL CITRATE (PF) 100 MCG/2ML IJ SOLN
INTRAMUSCULAR | Status: AC
  Filled 2016-04-04: qty 2

## 2016-04-04 MED ORDER — ONDANSETRON HCL 4 MG PO TABS: 4.0000 mg | ORAL_TABLET | Freq: Four times a day (QID) | ORAL | Status: DC | PRN

## 2016-04-04 MED ORDER — ONDANSETRON HCL 4 MG/2ML IJ SOLN: 4.0000 mg | Freq: Four times a day (QID) | INTRAMUSCULAR | Status: DC | PRN

## 2016-04-04 MED ORDER — PHENYLEPHRINE HCL 10 MG/ML IJ SOLN
INTRAMUSCULAR | Status: DC | PRN
  Administered 2016-04-04: 200 ug via INTRAVENOUS
  Administered 2016-04-04 (×7): 100 ug via INTRAVENOUS

## 2016-04-04 MED ORDER — CLINDAMYCIN PHOSPHATE 300 MG/50ML IV SOLN: 300.0000 mg | Freq: Four times a day (QID) | INTRAVENOUS | Status: DC

## 2016-04-04 MED ORDER — METOCLOPRAMIDE HCL 10 MG PO TABS: 5.0000 mg | ORAL_TABLET | Freq: Three times a day (TID) | ORAL | Status: DC | PRN

## 2016-04-04 MED ORDER — FLEET ENEMA 7-19 GM/118ML RE ENEM
1.0000 | ENEMA | Freq: Once | RECTAL | Status: AC | PRN
  Administered 2016-04-07: 1 via RECTAL

## 2016-04-04 MED ORDER — ROCURONIUM BROMIDE 100 MG/10ML IV SOLN
INTRAVENOUS | Status: DC | PRN
  Administered 2016-04-04: 20 mg via INTRAVENOUS

## 2016-04-04 MED ORDER — ONDANSETRON HCL 4 MG/2ML IJ SOLN: 4.0000 mg | Freq: Once | INTRAMUSCULAR | Status: DC | PRN

## 2016-04-04 MED ORDER — ASPIRIN EC 81 MG PO TBEC
81.0000 mg | DELAYED_RELEASE_TABLET | ORAL | Status: DC
  Administered 2016-04-06: 81 mg via ORAL
  Filled 2016-04-04: qty 1

## 2016-04-04 SURGICAL SUPPLY — 41 items
BIT DRILL 4.3MMS DISTAL GRDTED (BIT) ×1 IMPLANT
BNDG COHESIVE 4X5 TAN STRL (GAUZE/BANDAGES/DRESSINGS) ×3 IMPLANT
BNDG COHESIVE 6X5 TAN STRL LF (GAUZE/BANDAGES/DRESSINGS) ×3 IMPLANT
CANISTER SUCT 1200ML W/VALVE (MISCELLANEOUS) ×3 IMPLANT
CHLORAPREP W/TINT 26ML (MISCELLANEOUS) ×6 IMPLANT
DRAPE C-ARMOR (DRAPES) ×3 IMPLANT
DRAPE SHEET LG 3/4 BI-LAMINATE (DRAPES) ×3 IMPLANT
DRAPE SURG 17X11 SM STRL (DRAPES) ×3 IMPLANT
DRAPE U-SHAPE 47X51 STRL (DRAPES) ×3 IMPLANT
DRILL 4.3MMS DISTAL GRADUATED (BIT) ×3
DRSG OPSITE POSTOP 3X4 (GAUZE/BANDAGES/DRESSINGS) ×9 IMPLANT
DRSG OPSITE POSTOP 4X6 (GAUZE/BANDAGES/DRESSINGS) IMPLANT
ELECT CAUTERY BLADE 6.4 (BLADE) ×3 IMPLANT
ELECT REM PT RETURN 9FT ADLT (ELECTROSURGICAL) ×3
ELECTRODE REM PT RTRN 9FT ADLT (ELECTROSURGICAL) ×1 IMPLANT
GAUZE SPONGE 4X4 12PLY STRL (GAUZE/BANDAGES/DRESSINGS) ×3 IMPLANT
GLOVE BIO SURGEON STRL SZ8 (GLOVE) ×6 IMPLANT
GLOVE INDICATOR 8.0 STRL GRN (GLOVE) ×3 IMPLANT
GOWN STRL REUS W/ TWL LRG LVL3 (GOWN DISPOSABLE) ×1 IMPLANT
GOWN STRL REUS W/ TWL XL LVL3 (GOWN DISPOSABLE) ×1 IMPLANT
GOWN STRL REUS W/TWL LRG LVL3 (GOWN DISPOSABLE) ×2
GOWN STRL REUS W/TWL XL LVL3 (GOWN DISPOSABLE) ×2
GUIDEPIN VERSANAIL DSP 3.2X444 ×3 IMPLANT
GUIDEWIRE BALL NOSE 100CM (WIRE) ×3 IMPLANT
HFN RH 130 DEG 9MM X 340MM (Nail) ×3 IMPLANT
HIP FRAC NAIL LAG SCR 10.5X100 (Orthopedic Implant) ×2 IMPLANT
MAT BLUE FLOOR 46X72 FLO (MISCELLANEOUS) ×3 IMPLANT
NEEDLE FILTER BLUNT 18X 1/2SAF (NEEDLE) ×2
NEEDLE FILTER BLUNT 18X1 1/2 (NEEDLE) ×1 IMPLANT
NEEDLE HYPO 22GX1.5 SAFETY (NEEDLE) ×3 IMPLANT
NS IRRIG 500ML POUR BTL (IV SOLUTION) ×3 IMPLANT
PACK HIP COMPR (MISCELLANEOUS) ×3 IMPLANT
SCREW BONE CORTICAL 5.0X40 (Screw) ×3 IMPLANT
SCREW CANN THRD AFF 10.5X100 (Orthopedic Implant) ×1 IMPLANT
STAPLER SKIN PROX 35W (STAPLE) ×3 IMPLANT
STRAP SAFETY BODY (MISCELLANEOUS) ×3 IMPLANT
SUT VIC AB 1 CT1 36 (SUTURE) ×3 IMPLANT
SUT VIC AB 2-0 CT1 (SUTURE) IMPLANT
SYR 30ML LL (SYRINGE) ×3 IMPLANT
SYRINGE 10CC LL (SYRINGE) ×3 IMPLANT
TAPE MICROFOAM 4IN (TAPE) ×3 IMPLANT

## 2016-04-04 NOTE — Clinical Social Work Placement (Signed)
   CLINICAL SOCIAL WORK PLACEMENT  NOTE  Date:  04/04/2016  Patient Details  Name: Sierra Warren MRN: [id number] Date of Birth: 1920-11-21  Clinical Social Work is seeking post-discharge placement for this patient at the Skilled  Nursing Facility level of care (*CSW will initial, date and re-position this form in  chart as items are completed):  Yes   Patient/family provided with [facility] Clinical Social Work Department's list of facilities offering this level of care within the geographic area requested by the patient (or if unable, by the patient's family).  Yes   Patient/family informed of their freedom to choose among providers that offer the needed level of care, that participate in Medicare, Medicaid or managed care program needed by the patient, have an available bed and are willing to accept the patient.  Yes   Patient/family informed of [facility]'s ownership interest in [facility] and [facility], as well as of the fact that they are under no obligation to receive care at these facilities.  PASRR submitted to EDS on 04/04/16     PASRR number received on 04/04/16     Existing PASRR number confirmed on       FL2 transmitted to all facilities in geographic area requested by pt/family on 04/04/16     FL2 transmitted to all facilities within larger geographic area on       Patient informed that his/her managed care company has contracts with or will negotiate with certain facilities, including the following:            Patient/family informed of bed offers received.  Patient chooses bed at       Physician recommends and patient chooses bed at      Patient to be transferred to   on  .  Patient to be transferred to facility by       Patient family notified on   of transfer.  Name of family member notified:        PHYSICIAN       Additional Comment:    _______________________________________________ Haig Prophet, LCSW 04/04/2016, 9:37 AM

## 2016-04-04 NOTE — Transfer of Care (Signed)
Immediate Anesthesia Transfer of Care Note  Patient: Sierra Warren  Procedure(s) Performed: Procedure(s): INTRAMEDULLARY (IM) NAIL INTERTROCHANTRIC (Right)  Patient Location: PACU  Anesthesia Type:General  Level of Consciousness: sedated  Airway & Oxygen Therapy: Patient Spontanous Breathing and Patient connected to face mask oxygen  Post-op Assessment: Post -op Vital signs reviewed and stable  Post vital signs: stable  Last Vitals:  Filed Vitals:   04/04/16 1408 04/04/16 1736  BP: 106/46 114/79  Pulse: 87 83  Temp: 37.7 C 36.6 C  Resp: 16 11    Last Pain:  Filed Vitals:   04/04/16 1738  PainSc: 8       Patients Stated Pain Goal: 7 (04/04/16 1408)  Complications: No apparent anesthesia complications

## 2016-04-04 NOTE — Op Note (Signed)
04/03/2016 - 04/04/2016  5:33 PM  Patient:   Sierra Warren  Pre-Op Diagnosis:   Two-part intertrochanteric fracture, right hip.  Post-Op Diagnosis:   Same.  Procedure:   Reduction and internal fixation of right hip fracture with Biomet Affyxis TFN nail.  Surgeon:   Maryagnes Amos, MD  Assistant:   None  Anesthesia:   GET  Findings:   As above  Complications:   None  EBL:   125 cc  Fluids:   600 cc crystalloid  UOP:   100 cc  TT:   None  Drains:   None  Closure:   Staples  Implants:   Biomet Affyxis 9 x 340 mm TFN with a 100 mm lag screw and a 40 mm distal interlocking screw  Brief Clinical Note:   The patient is a 82 year old female who lives independently was in her usual state of health until she tripped and fell while bringing her garbage can back up the driveway. She was brought to the emergency room where x-rays demonstrated a two-part mildly displaced intertrochanteric fracture of her right hip. The patient has been cleared medically and presents at this time for reduction and internal fixation of the right hip fracture.  Procedure:   The patient was brought into the operating room. After adequate general endotracheal intubation and anesthesia was obtained, the patient was lain in the supine position on the fracture table. The uninjured leg was placed in a flexed and abducted position while the injured lower extremity was placed in longitudinal traction. The fracture was reduced using longitudinal traction and internal rotation. The adequacy of reduction was verified fluoroscopically in AP and lateral projections and found to be near anatomic. The lateral aspects of the right hip and thigh were prepped with ChloraPrep solution before being draped sterilely. Preoperative antibiotics were administered. The greater trochanter was identified fluoroscopically and an approximately 3 cm incision made about 2-3 fingerbreadths above the tip of the greater trochanter. The incision  was carried down through the subcutaneous tissues to expose the gluteal fascia. This was split the length of the incision, providing access to the tip of the trochanter. Under fluoroscopic guidance, a guidewire was drilled through the tip of the trochanter into the proximal metaphysis to the level of the lesser trochanter. After verifying its position fluoroscopically in AP and lateral projections, it was overreamed with the initial reamer to the depth of the lesser trochanter. A guidewire was passed down through the femoral canal to the supracondylar region. The adequacy of guidewire position was verified fluoroscopically in AP and lateral projections before the length of the guidewire within the canal was measured and found to be 340 mm. It was overreamed sequentially using the flexible reamers, beginning with a 9.5 mm reamer and progressing to a 11 mm reamer. This provided good cortical chatter. The 9 x 340 mm Biomet Affyxis TFN rod was selected and advanced to the appropriate depth, as verified fluoroscopically. The guide system for the lag screw was positioned and advanced through an approximately 2 cm stab incision over the lateral aspect of the proximal femur. The guidewire was drilled up through the trochanteric femoral nail and into the femoral neck to rest within 5 mm of subchondral bone. After verifying its position in the femoral neck and head in both AP and lateral projections, the guidewire was measured and found to be optimally replicated by a 100 mm lag screw. The guidewire was overreamed to the appropriate depth before the lag screw was inserted  and advanced to the appropriate depth as verified fluoroscopically in AP and lateral projections. The locking screw was advanced, then backed off a quarter turn to set the lag screw. Again the adequacy of hardware position and fracture reduction was verified fluoroscopically in AP and lateral projections and found to be excellent.  Attention was directed  distally. Using the "perfect circle" technique, the leg and fluoroscopy machine were positioned appropriately. An approximate 1.5 cm stab incision was made over the skin at the appropriate point before the drill bit was advanced through the cortex and across the static hole of the nail. The appropriate length of the screw was determined before the 40 mm distal interlocking screw was positioned, then advanced and tightened securely. Again the adequacy of screw position was verified fluoroscopically in AP and lateral projections and found to be excellent.  The wounds were irrigated thoroughly with sterile saline solution before the deeper subcutaneous tissues were closed using 2-0 Vicryl interrupted sutures. The skin was closed using staples. Sterile occlusive dressings were applied to all wounds before the patient was transferred back to his/her hospital bed. The patient was then transferred to the recovery room in satisfactory condition after tolerating the procedure well.

## 2016-04-04 NOTE — Progress Notes (Signed)
PT Cancellation Note  Patient Details Name: Sierra Warren MRN: [id number] DOB: 03-19-1921   Cancelled Treatment:    Reason Eval/Treat Not Completed: Other (comment). Pt for Sx this date secondary to R hip fracture. Will complete current orders. Need new orders for PT post-op.   Pavel Gadd 04/04/2016, 8:36 AM  Elizabeth Palau, PT, DPT [phone]

## 2016-04-04 NOTE — Progress Notes (Signed)
Blood pressure low. Pt resting between care. Prime doc paged awaiting response.

## 2016-04-04 NOTE — Care Management Important Message (Signed)
Important Message  Patient Details  Name: LECRETIA SEMLER MRN: [id number] Date of Birth: 1921-04-16   Medicare Important Message Given:  Yes    Kirstan Fentress A, RN 04/04/2016, 8:22 AM

## 2016-04-04 NOTE — NC FL2 (Signed)
[place] MEDICAID FL2 LEVEL OF CARE SCREENING TOOL     IDENTIFICATION  Patient Name: Sierra Warren Birthdate: 10/20/1921 Sex: female Admission Date (Current Location): 04/03/2016  [place] and [place] Number:  Chiropodist and Address:  [facility], [address], [place], [place] [address]      Provider Number: [id number]  Attending Physician Name and Address:  Katha Hamming, MD  Relative Name and Phone Number:       Current Level of Care: Hospital Recommended Level of Care: Skilled Nursing Facility Prior Approval Number:    Date Approved/Denied:   PASRR Number:  [id number])  Discharge Plan: SNF    Current Diagnoses: Patient Active Problem List   Diagnosis Date Noted  . Hip fracture (HCC) 04/03/2016    Orientation RESPIRATION BLADDER Height & Weight     Self, Time, Situation, Place  O2 (2 Liters Oxygen ) Continent Weight: 172 lb 1.6 oz (78.064 kg) Height:  5\' 3"  (160 cm)  BEHAVIORAL SYMPTOMS/MOOD NEUROLOGICAL BOWEL NUTRITION STATUS   (none )  (none ) Continent Diet (NPO for surgery )  AMBULATORY STATUS COMMUNICATION OF NEEDS Skin   Extensive Assist Verbally Surgical wounds                       Personal Care Assistance Level of Assistance  Bathing, Feeding, Dressing Bathing Assistance: Limited assistance Feeding assistance: Independent Dressing Assistance: Limited assistance     Functional Limitations Info  Hearing, Speech, Sight Sight Info: Adequate Hearing Info: Impaired Speech Info: Adequate    SPECIAL CARE FACTORS FREQUENCY  PT (By licensed PT), OT (By licensed OT)     PT Frequency:  (5) OT Frequency:  (5)            Contractures      Additional Factors Info  Code Status, Allergies Code Status Info:  (DNR ) Allergies Info:  (Codeine, Penicillins)           Current Medications (04/04/2016):  This is the current hospital active medication list Current Facility-Administered Medications   Medication Dose Route Frequency Provider Last Rate Last Dose  . 0.9 %  sodium chloride infusion   Intravenous Continuous Enedina Finner, MD 75 mL/hr at 04/04/16 0338    . acetaminophen (TYLENOL) tablet 650 mg  650 mg Oral Q6H PRN Enedina Finner, MD       Or  . acetaminophen (TYLENOL) suppository 650 mg  650 mg Rectal Q6H PRN Enedina Finner, MD      . antiseptic oral rinse (CPC / CETYLPYRIDINIUM CHLORIDE 0.05%) solution 7 mL  7 mL Mouth Rinse BID Enedina Finner, MD   7 mL at 04/03/16 2159  . bisacodyl (DULCOLAX) EC tablet 5 mg  5 mg Oral Daily PRN Enedina Finner, MD      . clindamycin (CLEOCIN) IVPB 600 mg  600 mg Intravenous Once Christena Flake, MD      . docusate sodium (COLACE) capsule 100 mg  100 mg Oral BID Enedina Finner, MD   100 mg at 04/03/16 2159  . glimepiride (AMARYL) tablet 4 mg  4 mg Oral Daily Enedina Finner, MD   4 mg at 04/03/16 1849  . insulin aspart (novoLOG) injection 0-15 Units  0-15 Units Subcutaneous TID WC Enedina Finner, MD   5 Units at 04/04/16 0818  . levothyroxine (SYNTHROID, LEVOTHROID) tablet 100 mcg  100 mcg Oral QAC breakfast Enedina Finner, MD      . metFORMIN (GLUCOPHAGE) tablet 500 mg  500  mg Oral Q breakfast Enedina Finner, MD      . morphine 2 MG/ML injection 1 mg  1 mg Intravenous Q4H PRN Enedina Finner, MD   1 mg at 04/04/16 0329  . ondansetron (ZOFRAN) tablet 4 mg  4 mg Oral Q6H PRN Enedina Finner, MD       Or  . ondansetron (ZOFRAN) injection 4 mg  4 mg Intravenous Q6H PRN Enedina Finner, MD      . pregabalin (LYRICA) capsule 50 mg  50 mg Oral BID Enedina Finner, MD   50 mg at 04/03/16 2144  . traMADol (ULTRAM) tablet 50 mg  50 mg Oral Q4H PRN Christena Flake, MD   50 mg at 04/03/16 2040  . zolpidem (AMBIEN) tablet 5 mg  5 mg Oral QHS Enedina Finner, MD   5 mg at 04/03/16 2144     Discharge Medications: Please see discharge summary for a list of discharge medications.  Relevant Imaging Results:  Relevant Lab Results:   Additional Information  (SSN: [id number])  Haig Prophet, LCSW

## 2016-04-04 NOTE — Progress Notes (Signed)
Reported BP and fever to  Dr Luberta Mutter orders received. Ortho made aware. Bolus started.

## 2016-04-04 NOTE — Care Management Note (Signed)
Case Management Note  Patient Details  Name: RAVEENA DOERFLER MRN: [id number] Date of Birth: [birth year]  Subjective/Objective:     82yo Mrs Irean Gadow received a rt hip fracture repair on 04/04/16 by Dr Joice Lofts. Resides at home independently. Is requesting SNF and [facility]-PT concurs.  CSW has arranged placement at [facility] for rehab on Monday 04/07/16.               Action/Plan:   Expected Discharge Date:                  Expected Discharge Plan:     In-House Referral:     Discharge planning Services     Post Acute Care Choice:    Choice offered to:     DME Arranged:    DME Agency:     HH Arranged:    HH Agency:     Status of Service:     Medicare Important Message Given:  Yes Date Medicare IM Given:    Medicare IM give by:    Date Additional Medicare IM Given:    Additional Medicare Important Message give by:     If discussed at Long Length of Stay Meetings, dates discussed:    Additional Comments:  Genifer Lazenby A, RN 04/04/2016, 10:31 AM

## 2016-04-04 NOTE — Clinical Social Work Note (Signed)
Clinical Social Work Assessment  Patient Details  Name: Sierra Warren MRN: [id number] Date of Birth: 12-18-1920  Date of referral:  04/04/16               Reason for consult:  Facility Placement                Permission sought to share information with:  Chartered certified accountant granted to share information::  Yes, Verbal Permission Granted  Name::      [place]::   [place]   Relationship::     Contact Information:     Housing/Transportation Living arrangements for the past 2 months:  [place] of Information:  Patient, Adult Children Patient Interpreter Needed:  None Criminal Activity/Legal Involvement Pertinent to Current Situation/Hospitalization:  No - Comment as needed Significant Relationships:  Adult Children, Siblings Lives with:  Adult Children Do you feel safe going back to the place where you live?  Yes Need for family participation in patient care:  Yes (Comment)  Care giving concerns:  Patient lives with her daughter Sierra Warren [phone] in [place].    Social Worker assessment / plan:  Holiday representative (CSW) received SNF consult. CSW met with patient and her daughters Sierra Warren and Sierra Warren and her brother Sierra Warren were at bedside. Patient was alert and oriented and was laying in the bed. Patient is scheduled for surgery today for a hip fracture. CSW introduced self and explained role of CSW department. Patient reported that she lives with her daughter Sierra Warren in [place]. Patient has 3 daughters Sierra Warren, [place] and Sierra Warren. Per daughter Haynes Kerns is patient's POA but does not have HPOA. CSW explained that PT will evaluate patient after surgery and make a recommendation for after care. Patient and daughter prefer for patient to go to Rehab at [facility]. CSW explained that patient's Health Team insurance will have to approve SNF. Patient and daughters verbalized their understanding.   FL2 complete and faxed out. CSW  presented bed offers to patient and daughter. They chose [facility]. [facility] liaison is aware of accepted bed offer. [facility] Team case manager is aware of above. CSW will continue to follow and assist as needed.     Employment status:  Disabled (Comment on whether or not currently receiving Disability), Retired Nurse, adult PT Recommendations:  Not assessed at this time Information / Referral to community resources:  [place]  Patient/Family's Response to care:  Patient and daughters are agreeable for patient to go to [facility].   Patient/Family's Understanding of and Emotional Response to Diagnosis, Current Treatment, and Prognosis:  Patient and daughters were pleasant and thanked CSW for visit.   Emotional Assessment Appearance:  Appears stated age Attitude/Demeanor/Rapport:    Affect (typically observed):  Accepting, Adaptable, Pleasant Orientation:  Oriented to Self, Oriented to Place, Oriented to  Time, Oriented to Situation Alcohol / Substance use:  Not Applicable Psych involvement (Current and /or in the community):  No (Comment)  Discharge Needs  Concerns to be addressed:  Discharge Planning Concerns Readmission within the last 30 days:  No Current discharge risk:  Dependent with Mobility Barriers to Discharge:  Continued Medical Work up   Loralyn Freshwater, LCSW 04/04/2016, 9:45 AM

## 2016-04-04 NOTE — Progress Notes (Addendum)
[facility] - [place] [facility]   PATIENT NAME: Sierra Warren    MR#:  [id number]  DATE OF BIRTH:  [birth year]  SUBJECTIVE: Patient admitted for right hip fracture. Scheduled to have surgery this afternoon. She is hypotensive now secondary to morphine. Denies throat pain or dysuria. Aggressively hydrating her.   CHIEF COMPLAINT:   Chief Complaint  Patient presents with  . Fall    REVIEW OF SYSTEMS:   ROS CONSTITUTIONAL: No fever, fatigue or weakness.  EYES: No blurred or double vision.  EARS, NOSE, AND THROAT: No tinnitus or ear pain.  RESPIRATORY: No cough, shortness of breath, wheezing or hemoptysis.  CARDIOVASCULAR: No chest pain, orthopnea, edema.  GASTROINTESTINAL: No nausea, vomiting, diarrhea or abdominal pain.  GENITOURINARY: No dysuria, hematuria.  ENDOCRINE: No polyuria, nocturia,  HEMATOLOGY: No anemia, easy bruising or bleeding SKIN: No rash or lesion. MUSCULOSKELETAL: No joint pain or arthritis.   NEUROLOGIC: Right hip pain.  PSYCHIATRY: No anxiety or depression.   DRUG ALLERGIES:   Allergies  Allergen Reactions  . Codeine Other (See Comments)    Upset stomach, diarrhea  . Penicillins Swelling    VITALS:  Blood pressure 79/37, pulse 93, temperature 100.2 F (37.9 C), temperature source Oral, resp. rate 16, height  (1.6 m), weight 78.064 kg (172 lb 1.6 oz), SpO2 94 %.  PHYSICAL EXAMINATION:  GENERAL:  82 y.o.-year-old patient lying in the bed with no acute distress.  EYES: Pupils equal, round, reactive to light and accommodation. No scleral icterus. Extraocular muscles intact.  HEENT: Head atraumatic, normocephalic. Oropharynx and nasopharynx clear.  NECK:  Supple, no jugular venous distention. No thyroid enlargement, no tenderness.  LUNGS: Normal breath sounds bilaterally, no wheezing, rales,rhonchi or crepitation. No use of accessory muscles of respiration.  CARDIOVASCULAR: S1, S2 normal. Tachycardic. No murmurs, rubs, or  gallops.  ABDOMEN: Soft, nontender, nondistended. Bowel sounds present. No organomegaly or mass.  EXTREMITIES: No pedal edema, cyanosis, or clubbing.  NEUROLOGIC: Cranial nerves II through XII are intact. Muscle strength 5/5 in all extremities. Sensation intact. Gait not checked.  PSYCHIATRIC: The patient is alert and oriented x 3.  SKIN: No obvious rash, lesion, or ulcer.    LABORATORY PANEL:   CBC  Recent Labs Lab 04/03/16 1419  WBC 12.0*  HGB 12.2  HCT 36.5  PLT 186   ------------------------------------------------------------------------------------------------------------------  Chemistries   Recent Labs Lab 04/03/16 1419 04/04/16 0444  NA 132* 135  K 4.1 4.6  CL 97* 102  CO2 27 27  GLUCOSE 277* 211*  BUN 23* 28*  CREATININE 1.36* 1.97*  CALCIUM 9.3 8.5*  AST 27  --   ALT 22  --   ALKPHOS 114  --   BILITOT 0.5  --    ------------------------------------------------------------------------------------------------------------------  Cardiac Enzymes No results for input(s): TROPONINI in the last 168 hours. ------------------------------------------------------------------------------------------------------------------  RADIOLOGY:  Dg Chest 1 View  04/03/2016  CLINICAL DATA:  82 year old female with a history of right hip pain EXAM: CHEST 1 VIEW COMPARISON:  11/26/2015 FINDINGS: Cardiomediastinal silhouette unchanged in size and contour. No evidence of central vascular congestion. No confluent airspace disease or pneumothorax.  No pleural effusion. No displaced fracture.  Healed left-sided rib fractures. Surgical changes of the right upper abdomen. IMPRESSION: Negative for acute cardiopulmonary disease. Signed, Yvone Neu. Loreta Ave, DO [facility] Electronically Signed   By: Gilmer Mor D.O.   On: 04/03/2016 15:23   Dg Hip Unilat With Pelvis 2-3 Views Right  04/03/2016  CLINICAL DATA:  Status post fall  today with onset of right hip pain. No known injury. Initial encounter. EXAM: DG HIP (WITH OR WITHOUT PELVIS) 2-3V RIGHT COMPARISON:  None. FINDINGS: The patient has an acute right intertrochanteric fracture. No other acute bony or joint abnormality is seen. Bones are osteopenic. Bilateral hip degenerative changes seen. IMPRESSION: Acute right intertrochanteric fracture. Osteopenia. Bilateral hip osteoarthritis. Electronically Signed   By: Drusilla Kanner M.D.   On: 04/03/2016 15:14    EKG:   Orders placed or performed in visit on 11/20/11  . EKG 12-Lead    ASSESSMENT AND PLAN:   #1 .hypotension likely secondary to narcotics: And also possibly due to early sepsis: Initial chest x-ray, urinalysis are negative for infection, 2.right hip fracture: Patient is on IV morphine but she is very sensitive and causing hypotension. Limit narcotics, continue DVT prophylaxis, scheduled to have surgery this morning. #3 low-grade fever and hypotension hy[poxia;requring 2litres<check the chest x-ray again. #4 essential hypertension: Now has hypotension hold the BP medicines. #5 diabetes mellitus type 2: continue metformin,amaryl. insulin with sliding scale coverage continue that. #5 hypothyroidism continue Synthroid.  #6 acute renal failure on chronic renal failure with chronic kidney disease stage III: ATN likely secondary to hypotension 'Continue IV hydration ,hold HCTZ.  bp still low despite 1 L fluid bolus of normal saline, maintenance rate of normal saline at 75 cc an hour. We are going to check a stat CBC to see if she is bleeding and has anemia, also check the troponins. D/W  with Dr. Joice Lofts. All the records are reviewed and case discussed with Care Management/Social Workerr. Management plans discussed with the patient, family and they are in agreement.  CODE STATUS:full  TOTAL TIME TAKING CARE OF THIS PATIENT: 35 minutes.   POSSIBLE D/C IN 3-5 DAYS, DEPENDING ON CLINICAL  CONDITION.   Katha Hamming M.D on 04/04/2016 at 8:27 AM  Between 7am to 6pm - Pager - [phone]  After 6pm go to [email or web address] - password [email or web address]  [facility]  Office  [phone]  CC: Primary care physician; Danella Penton, MD   Note: This dictation was prepared with Dragon dictation along with smaller phrase technology. Any transcriptional errors that result from this process are unintentional.

## 2016-04-04 NOTE — Progress Notes (Signed)
IV 500 bolus given, IV fluids infusing. Pt easily arousalable. Family at bedside with concerns answered. Ice packs applied to Rhip.

## 2016-04-04 NOTE — Anesthesia Preprocedure Evaluation (Signed)
Anesthesia Evaluation  Patient identified by MRN, date of birth, ID band Patient awake    Reviewed: Allergy & Precautions, H&P , NPO status , Patient's Chart, lab work & pertinent test results, reviewed documented beta blocker date and time   Airway Mallampati: II  TM Distance: >3 FB Neck ROM: full    Dental  (+) Teeth Intact   Pulmonary neg pulmonary ROS,    Pulmonary exam normal        Cardiovascular hypertension, On Medications negative cardio ROS Normal cardiovascular exam Rhythm:regular Rate:Normal     Neuro/Psych negative neurological ROS  negative psych ROS   GI/Hepatic negative GI ROS, Neg liver ROS,   Endo/Other  negative endocrine ROSdiabetes  Renal/GU negative Renal ROS  negative genitourinary   Musculoskeletal   Abdominal   Peds  Hematology negative hematology ROS (+)   Anesthesia Other Findings Past Medical History:   Diabetes mellitus without complication (HCC)                 Thyroid disease                                              Hypertension                                               Past Surgical History:   CHOLECYSTECTOMY                                               ABDOMINAL HYSTERECTOMY                                        SMALL INTESTINE SURGERY                                       EYE SURGERY                                     Bilateral                Comment:cataracts BMI    Body Mass Index   30.47 kg/m 2     Reproductive/Obstetrics negative OB ROS                             Anesthesia Physical Anesthesia Plan  ASA: III and emergent  Anesthesia Plan: General ETT   Post-op Pain Management:    Induction:   Airway Management Planned:   Additional Equipment:   Intra-op Plan:   Post-operative Plan:   Informed Consent: I have reviewed the patients History and Physical, chart, labs and discussed the procedure including the risks,  benefits and alternatives for the proposed anesthesia with the patient or authorized representative who has indicated his/her understanding and acceptance.   Dental Advisory Given  Plan Discussed with: CRNA  Anesthesia  Plan Comments:         Anesthesia Quick Evaluation

## 2016-04-04 NOTE — Anesthesia Procedure Notes (Signed)
Procedure Name: Intubation Date/Time: 04/04/2016 4:04 PM Performed by: Ginger Carne Pre-anesthesia Checklist: Patient identified, Emergency Drugs available, Suction available, Patient being monitored and Timeout performed Patient Re-evaluated:Patient Re-evaluated prior to inductionOxygen Delivery Method: Circle system utilized Preoxygenation: Pre-oxygenation with 100% oxygen Intubation Type: IV induction Ventilation: Mask ventilation without difficulty and Oral airway inserted - appropriate to patient size Laryngoscope Size: Hyacinth Meeker and 2 Grade View: Grade I Tube type: Oral Tube size: 7.0 mm Number of attempts: 1 Airway Equipment and Method: Stylet Placement Confirmation: ETT inserted through vocal cords under direct vision,  positive ETCO2 and breath sounds checked- equal and bilateral Secured at: 20 cm Tube secured with: Tape Dental Injury: Teeth and Oropharynx as per pre-operative assessment

## 2016-04-05 ENCOUNTER — Inpatient Hospital Stay: Payer: PPO

## 2016-04-05 LAB — CBC
HEMATOCRIT: 26 % — AB (ref 35.0–47.0)
Hemoglobin: 8.5 g/dL — ABNORMAL LOW (ref 12.0–16.0)
MCH: 29.9 pg (ref 26.0–34.0)
MCHC: 32.9 g/dL (ref 32.0–36.0)
MCV: 90.8 fL (ref 80.0–100.0)
Platelets: 143 10*3/uL — ABNORMAL LOW (ref 150–440)
RBC: 2.86 MIL/uL — ABNORMAL LOW (ref 3.80–5.20)
RDW: 13.2 % (ref 11.5–14.5)
WBC: 10.3 10*3/uL (ref 3.6–11.0)

## 2016-04-05 LAB — GLUCOSE, CAPILLARY
GLUCOSE-CAPILLARY: 239 mg/dL — AB (ref 65–99)
GLUCOSE-CAPILLARY: 215 mg/dL — AB (ref 65–99)
Glucose-Capillary: 216 mg/dL — ABNORMAL HIGH (ref 65–99)
Glucose-Capillary: 235 mg/dL — ABNORMAL HIGH (ref 65–99)

## 2016-04-05 LAB — BASIC METABOLIC PANEL
Anion gap: 6 (ref 5–15)
BUN: 30 mg/dL — ABNORMAL HIGH (ref 6–20)
CALCIUM: 7.2 mg/dL — AB (ref 8.9–10.3)
CO2: 21 mmol/L — AB (ref 22–32)
CREATININE: 2 mg/dL — AB (ref 0.44–1.00)
Chloride: 107 mmol/L (ref 101–111)
GFR calc non Af Amer: 21 mL/min — ABNORMAL LOW (ref >60)
GFR, EST AFRICAN AMERICAN: 24 mL/min — AB (ref >60)
GLUCOSE: 225 mg/dL — AB (ref 65–99)
Potassium: 4.7 mmol/L (ref 3.5–5.1)
Sodium: 134 mmol/L — ABNORMAL LOW (ref 135–145)

## 2016-04-05 LAB — TSH: TSH: 0.673 u[IU]/mL (ref 0.350–4.500)

## 2016-04-05 MED ORDER — SODIUM CHLORIDE 0.9 % IV SOLN
INTRAVENOUS | Status: AC
  Administered 2016-04-05: via INTRAVENOUS

## 2016-04-05 MED ORDER — HYDROCORTISONE NA SUCCINATE PF 100 MG IJ SOLR
50.0000 mg | Freq: Four times a day (QID) | INTRAMUSCULAR | Status: DC
  Administered 2016-04-05 – 2016-04-07 (×6): 50 mg via INTRAVENOUS
  Filled 2016-04-05 (×2): qty 1
  Filled 2016-04-05: qty 2
  Filled 2016-04-05 (×2): qty 1
  Filled 2016-04-05: qty 2
  Filled 2016-04-05: qty 1
  Filled 2016-04-05: qty 2
  Filled 2016-04-05 (×3): qty 1

## 2016-04-05 NOTE — Progress Notes (Signed)
Vascular wellness called to obtain picc line placement. Consents signed and pt education given. Nurse to call back with ETA

## 2016-04-05 NOTE — Evaluation (Signed)
Physical Therapy Evaluation Patient Details Name: Sierra Warren MRN: [id number] DOB: Mar 31, 1921 Today's Date: 04/05/2016   History of Present Illness  Pt is a 82 y.o. female s/p mechanical fall sustaining acute R intertrochanteric fx.  Pt also with hypotension pre and post-op.  Pt s/p ORIF 04/04/16.  PMH includes htn and DM.  Clinical Impression  Prior to admission, pt was independent ambulating without AD.  Pt lives with her daughter in 1 level home with stairs to enter.  Currently pt is max assist supine to/from sit and max assist to attempt to stand x2 trials (pt almost able to come to full standing with use of RW).  Pt's BP supine 88/41; sitting 94/51; and after attempting standing (but taken in sitting position) was 113/70.  Pt would benefit from skilled PT to address noted impairments and functional limitations.  Recommend pt discharge to STR when medically appropriate.     Follow Up Recommendations SNF    Equipment Recommendations       Recommendations for Other Services       Precautions / Restrictions Precautions Precautions: Fall Restrictions Weight Bearing Restrictions: Yes RLE Weight Bearing: Weight bearing as tolerated      Mobility  Bed Mobility Overal bed mobility: Needs Assistance Bed Mobility: Supine to Sit;Sit to Supine     Supine to sit: Max assist;HOB elevated Sit to supine: Max assist;HOB elevated   General bed mobility comments: assist for trunk and B LE's; vc's for technique required; increased time to perform required  Transfers Overall transfer level: Needs assistance Equipment used: Rolling walker (2 wheeled) Transfers: Sit to/from Stand Sit to Stand: Max assist         General transfer comment: x2 trials; vc's required for hand and feet placement and increased time required to perform; pt almost able to come to full upright position on 2nd attempt  Ambulation/Gait             General Gait Details: not appropriate at this  time  Stairs            Wheelchair Mobility    Modified Rankin (Stroke Patients Only)       Balance Overall balance assessment: Needs assistance Sitting-balance support: Bilateral upper extremity supported;Feet supported Sitting balance-Leahy Scale: Fair                                       Pertinent Vitals/Pain Pain Assessment: 0-10 Pain Score: 10-Worst pain ever Pain Location: R hip Pain Descriptors / Indicators: Aching;Sore;Tender (sharp shooting pain with movement) Pain Intervention(s): Limited activity within patient's tolerance;Monitored during session;Premedicated before session;Repositioned;Ice applied  See flow sheet for HR and O2 vitals.    Home Living Family/patient expects to be discharged to:: Private residence Living Arrangements: Children Available Help at Discharge: Family Type of Home: House Home Access: Stairs to enter Entrance Stairs-Rails: Right;Left;Can reach both Entrance Stairs-Number of Steps: 4 Home Layout: One level Home Equipment: Cane - single point;Walker - 2 wheels;Walker - 4 wheels;Shower seat Additional Comments: Pt lives with her daughter.    Prior Function Level of Independence: Independent         Comments: Pt ambulating without AD.  Pt showers modified independently using shower chair.     Hand Dominance        Extremity/Trunk Assessment   Upper Extremity Assessment: Generalized weakness           Lower Extremity Assessment:  RLE deficits/detail;LLE deficits/detail RLE Deficits / Details: R hip flexion, knee flexion/extension at least 2+/5 (limited d/t pain); R DF at least 3+/5 LLE Deficits / Details: L LE strength at least 3+/5 (hip flexion, knee flexion/extension, and DF); L LE ROM WLF  Cervical / Trunk Assessment: Normal  Communication   Communication: HOH  Cognition Arousal/Alertness: Awake/alert Behavior During Therapy: WFL for tasks assessed/performed Overall Cognitive Status: Within  Functional Limits for tasks assessed                      General Comments General comments (skin integrity, edema, etc.): R LE dressings in place  Nursing cleared pt for participation in physical therapy.  Pt and pt's daughter agreeable to PT session.     Exercises Total Joint Exercises Ankle Circles/Pumps: AROM;Strengthening;Both;10 reps;Supine Quad Sets: AROM;Strengthening;Both;10 reps;Supine Short Arc Quad: Strengthening;Both;10 reps;Supine (AAROM R; AROM L) Heel Slides: Strengthening;Both;10 reps;Supine (AAROM R; AROM L) Hip ABduction/ADduction: Strengthening;Both;10 reps;Supine (AAROM R; AROM L)      Assessment/Plan    PT Assessment Patient needs continued PT services  PT Diagnosis Difficulty walking;Generalized weakness;Acute pain   PT Problem List Decreased strength;Decreased range of motion;Decreased activity tolerance;Decreased balance;Decreased mobility;Decreased knowledge of use of DME;Decreased safety awareness;Decreased knowledge of precautions;Pain  PT Treatment Interventions DME instruction;Gait training;Stair training;Functional mobility training;Therapeutic activities;Therapeutic exercise;Balance training;Patient/family education   PT Goals (Current goals can be found in the Care Plan section) Acute Rehab PT Goals Patient Stated Goal: to have less pain in R LE PT Goal Formulation: With patient Time For Goal Achievement: 04/19/16 Potential to Achieve Goals: Fair    Frequency BID   Barriers to discharge Decreased caregiver support      Co-evaluation               End of Session Equipment Utilized During Treatment: Gait belt;Oxygen (2 L/min via nasal cannula) Activity Tolerance: Patient limited by fatigue;Patient limited by pain Patient left: in bed;with call bell/phone within reach;with bed alarm set;with family/visitor present;with SCD's reapplied (B heels elevated via towel rolls; ice packs applied) Nurse Communication: Mobility  status;Precautions;Weight bearing status         Time: 8563-1497 PT Time Calculation (min) (ACUTE ONLY): 58 min   Charges:   PT Evaluation $PT Eval Moderate Complexity: 1 Procedure PT Treatments $Therapeutic Exercise: 8-22 mins $Therapeutic Activity: 8-22 mins   PT G CodesHendricks Limes 2016-04-10, 12:51 PM Hendricks Limes, PT [phone]

## 2016-04-05 NOTE — Progress Notes (Signed)
[facility] - [place] at [facility]   PATIENT NAME: Sierra Warren    MR#:  [id number]  DATE OF BIRTH:  1920-11-26  SUBJECTIVE:  CHIEF COMPLAINT:   Chief Complaint  Patient presents with  . Fall   - Generalized weakness. Postoperative day 1 after her right hip surgery. -Blood pressure continues to remain low. Slight worsening of renal function noted -Daughter at bedside  REVIEW OF SYSTEMS:  Review of Systems  Constitutional: Positive for malaise/fatigue. Negative for fever and chills.  HENT: Negative for ear discharge, ear pain and nosebleeds.   Eyes: Negative for blurred vision and double vision.  Respiratory: Negative for cough, shortness of breath and wheezing.   Cardiovascular: Negative for chest pain, palpitations and leg swelling.  Gastrointestinal: Negative for nausea, vomiting, abdominal pain, diarrhea and constipation.  Genitourinary: Negative for dysuria.  Musculoskeletal: Positive for myalgias and joint pain.  Neurological: Positive for weakness. Negative for dizziness, sensory change, speech change, focal weakness, seizures and headaches.  Psychiatric/Behavioral: Negative for depression.    DRUG ALLERGIES:   Allergies  Allergen Reactions  . Codeine Other (See Comments)    Upset stomach, diarrhea  . Penicillins Swelling    VITALS:  Blood pressure 90/48, pulse 76, temperature 97.6 F (36.4 C), temperature source Oral, resp. rate 16, height  (1.6 m), weight 78.019 kg (172 lb), SpO2 98 %.  PHYSICAL EXAMINATION:  Physical Exam  GENERAL:  82 y.o.-year-old patient lying in the bed with no acute distress.  EYES: Pupils equal, round, reactive to light and accommodation. No scleral icterus. Extraocular muscles intact.  HEENT: Head atraumatic, normocephalic. Oropharynx and nasopharynx clear.  NECK:  Supple, no jugular venous distention. No thyroid enlargement, no tenderness.  LUNGS: Normal breath sounds bilaterally, no wheezing,  rales,rhonchi or crepitation. No use of accessory muscles of respiration.  CARDIOVASCULAR: S1, S2 normal. No , rubs, or gallops. 3/6 systolic murmur present ABDOMEN: Soft, nontender, nondistended. Bowel sounds present. No organomegaly or mass.  EXTREMITIES: No pedal edema, cyanosis, or clubbing. Dressing of the right hip noted. NEUROLOGIC: Cranial nerves II through XII are intact. Muscle strength 5/5 in all extremities. Sensation intact. Gait not checked. Global weakness noted PSYCHIATRIC: The patient is alert and oriented x 3.  SKIN: No obvious rash, lesion, or ulcer.    LABORATORY PANEL:   CBC  Recent Labs Lab 04/05/16 0346  WBC 10.3  HGB 8.5*  HCT 26.0*  PLT 143*   ------------------------------------------------------------------------------------------------------------------  Chemistries   Recent Labs Lab 04/03/16 1419  04/05/16 0346  NA 132*  < > 134*  K 4.1  < > 4.7  CL 97*  < > 107  CO2 27  < > 21*  GLUCOSE 277*  < > 225*  BUN 23*  < > 30*  CREATININE 1.36*  < > 2.00*  CALCIUM 9.3  < > 7.2*  AST 27  --   --   ALT 22  --   --   ALKPHOS 114  --   --   BILITOT 0.5  --   --   < > = values in this interval not displayed. ------------------------------------------------------------------------------------------------------------------  Cardiac Enzymes  Recent Labs Lab 04/04/16 0444  TROPONINI <0.03   ------------------------------------------------------------------------------------------------------------------  RADIOLOGY:  Dg Chest 1 View  04/04/2016  CLINICAL DATA:  Hypoxia EXAM: CHEST 1 VIEW COMPARISON:  Yesterday FINDINGS: Lungs are under aerated and grossly clear. Chronic left-sided rib deformities are noted. The heart is prominent. No pneumothorax. Chronic deformity of the left clavicle is noted.  IMPRESSION: No active disease. Electronically Signed   By: Jolaine Click M.D.   On: 04/04/2016 09:29   Dg Chest 1 View  04/03/2016  CLINICAL DATA:   82 year old female with a history of right hip pain EXAM: CHEST 1 VIEW COMPARISON:  11/26/2015 FINDINGS: Cardiomediastinal silhouette unchanged in size and contour. No evidence of central vascular congestion. No confluent airspace disease or pneumothorax.  No pleural effusion. No displaced fracture.  Healed left-sided rib fractures. Surgical changes of the right upper abdomen. IMPRESSION: Negative for acute cardiopulmonary disease. Signed, Yvone Neu. Loreta Ave, DO [facility] Electronically Signed   By: Gilmer Mor D.O.   On: 04/03/2016 15:23   Dg Hip Operative Unilat W Or W/o Pelvis Right  04/04/2016  CLINICAL DATA:  Operative imaging for following ORIF of a right intertrochanteric proximal femur fracture EXAM: OPERATIVE RIGHT HIP (WITH PELVIS IF PERFORMED) 4 VIEWS TECHNIQUE: Fluoroscopic spot image(s) were submitted for interpretation post-operatively. COMPARISON:  04/03/2016 FINDINGS: Images show placement of an intra medullary rod supporting a compression screw reducing the intertrochanteric fracture components into near anatomic alignment. The orthopedic hardware is well-seated. There is no evidence of a new fracture or operative complication. IMPRESSION: Well-aligned fracture components following ORIF of the right intertrochanteric proximal femur fracture. Electronically Signed   By: Amie Portland M.D.   On: 04/04/2016 17:28   Dg Hip Unilat With Pelvis 2-3 Views Right  04/03/2016  CLINICAL DATA:  Status post fall today with onset of right hip pain. No known injury. Initial encounter. EXAM: DG HIP (WITH OR WITHOUT PELVIS) 2-3V RIGHT COMPARISON:  None. FINDINGS: The patient has an acute right intertrochanteric fracture. No other acute bony or joint abnormality is seen. Bones are osteopenic. Bilateral hip degenerative changes seen. IMPRESSION: Acute right intertrochanteric fracture. Osteopenia. Bilateral hip osteoarthritis. Electronically Signed   By:  Drusilla Kanner M.D.   On: 04/03/2016 15:14    EKG:   Orders placed or performed in visit on 11/20/11  . EKG 12-Lead    ASSESSMENT AND PLAN:   82 year old female with past medical history significant for hypertension, diabetes, hypothyroidism who lives independently presented to the hospital after a mechanical fall and right hip fracture.  #1 right intertrochanteric hip fracture-appreciate orthopedics input. Postoperative day 1 after surgery - physical therapy, pain control and DVT prophylaxis  #2 Hypotension-since surgery. Not on any antihypertensives. No evidence of infection noted. Continue IV fluids. -Add stress dose steroids and check TSH and cortisol.  #3 ARF-on CKD. Baseline cr seems to be around 1.3. Creatinine worsened at 2. Likely ATN from hypotension. -Improved blood pressures. Avoid nephrotoxins. Monitor creatinine -Renal ultrasound if continues to get worse. -Monitor urine output  #4 Anemia- acute on chronic Likely postsurgical. -Continue to monitor hemoglobin and transfuse the blood pressure is low and hemoglobin less than 8  #5 Hypothyroidism-continue Synthroid. Check TSH  #6 DVT Prophylaxis- on Lovenox  Physical therapy consulted. Will need SNF at discharge   All the records are reviewed and case discussed with Care Management/Social Workerr. Management plans discussed with the patient, family and they are in agreement.  CODE STATUS: DNR  TOTAL TIME TAKING CARE OF THIS PATIENT: 37 minutes.   POSSIBLE D/C IN 1-2 DAYS, DEPENDING ON CLINICAL CONDITION.   Rosellen Lichtenberger M.D on 04/05/2016 at 1:32 PM  Between 7am to 6pm - Pager - [phone]  After 6pm go to [email or web address] - password [email or web address] [facility]  [facility] [place] [facility]  Office  [phone]  CC: Primary care physician; Hardin Negus  Sabra Heck, MD

## 2016-04-05 NOTE — Plan of Care (Signed)
Problem: Pain Management: Goal: Pain level will decrease Outcome: Progressing Pain controlled with scheduled medication

## 2016-04-05 NOTE — Progress Notes (Signed)
Prime doc paged. Foley removed at 1030 and patient has yet to void by 1830. Waiting on callback.

## 2016-04-05 NOTE — Plan of Care (Signed)
Problem: Nutrition: Goal: Ability to attain and maintain optimal nutritional status will improve Outcome: Progressing Pt able to keep foods and liquids down. Menu upgraded

## 2016-04-05 NOTE — Progress Notes (Signed)
Subjective: 1 Day Post-Op Procedure(s) (LRB): INTRAMEDULLARY (IM) NAIL INTERTROCHANTRIC (Right) Patient reports pain as unable to give an accurate pain score..   Patient is well but having issues with hypotension. Plan is to go Skilled nursing facility after hospital stay. Negative for chest pain and shortness of breath Fever: no Gastrointestinal:Negative for nausea and vomiting  Objective: Vital signs in last 24 hours: Temp:  [97.5 F (36.4 C)-99.9 F (37.7 C)] 97.5 F (36.4 C) (05/20 0425) Pulse Rate:  [82-88] 84 (05/20 0425) Resp:  [11-18] 16 (05/20 0425) BP: (79-114)/(36-79) 90/45 mmHg (05/20 0425) SpO2:  [9 %-100 %] 99 % (05/20 0425) Weight:  [78.019 kg (172 lb)] 78.019 kg (172 lb) (05/19 1408)  Intake/Output from previous day:  Intake/Output Summary (Last 24 hours) at 04/05/16 0809 Last data filed at 04/05/16 0400  Gross per 24 hour  Intake   1900 ml  Output    425 ml  Net   1475 ml    Intake/Output this shift:    Labs:  Recent Labs  04/03/16 1419 04/04/16 0444 04/05/16 0346  HGB 12.2 10.2* 8.5*    Recent Labs  04/04/16 0444 04/05/16 0346  WBC 11.6* 10.3  RBC 3.37* 2.86*  HCT 30.5* 26.0*  PLT 188 143*    Recent Labs  04/04/16 0444 04/05/16 0346  NA 135 134*  K 4.6 4.7  CL 102 107  CO2 27 21*  BUN 28* 30*  CREATININE 1.97* 2.00*  GLUCOSE 211* 225*  CALCIUM 8.5* 7.2*    Recent Labs  04/03/16 1419 04/03/16 1927  INR 1.01 1.10     EXAM General - Patient is Alert, Lacking and drowsy but arrousable Extremity - Neurologically intact ABD soft Sensation intact distally Incision: dressing C/D/I No cellulitis present Dressing/Incision - clean, dry, no drainage Motor Function - intact, moving foot and toes well on exam.   Abdomen is soft with normal BS, no tympany.  Past Medical History  Diagnosis Date  . Diabetes mellitus without complication (HCC)   . Thyroid disease   . Hypertension     Assessment/Plan: 1 Day Post-Op  Procedure(s) (LRB): INTRAMEDULLARY (IM) NAIL INTERTROCHANTRIC (Right) Active Problems:   Hip fracture (HCC)  Estimated body mass index is 30.48 kg/(m^2) as calculated from the following:   Height as of this encounter:  (1.6 m).   Weight as of this encounter: 78.019 kg (172 lb). Advance diet Up with therapy   BP 90/45 this AM, improvement from yesterday. HR 84  Continue IVF Pt has not had a BM. Labs reviewed, Hg 8.5 this AM, will continue to monitor for transfusion.  CBC and BMP ordered for tomorrow morning. Pt will likely need SNF placement, Care management to assist with discharge.  DVT Prophylaxis - Lovenox, Foot Pumps and TED hose Weight-Bearing as tolerated to right leg  J. Horris Latino, PA-C [facility] Orthopaedic Surgery 04/05/2016, 8:09 AM

## 2016-04-05 NOTE — Progress Notes (Signed)
Physical Therapy Treatment Patient Details Name: JAY KEMPE MRN: [id number] DOB: 1921/01/20 Today's Date: 04/05/2016    History of Present Illness Pt is a 82 y.o. female s/p mechanical fall sustaining acute R intertrochanteric fx.  Pt also with hypotension pre and post-op.  Pt s/p ORIF 04/04/16.  PMH includes htn and DM.    PT Comments    Pt requesting in bed ex's only this afternoon (pt reporting fatigue and 10/10 pain in R thigh).  Pt demonstrated improved tolerance to R LE ex's compared to morning PT session.  Will progress pt with standing and attempt transfer to chair as able/appropriate next session.   Follow Up Recommendations  SNF     Equipment Recommendations       Recommendations for Other Services       Precautions / Restrictions Precautions Precautions: Fall Restrictions Weight Bearing Restrictions: Yes RLE Weight Bearing: Weight bearing as tolerated    Mobility  Bed Mobility       General bed mobility comments: Deferred OOB mobility per pt request for LE ex's only this afternoon.  Transfers          Ambulation/Gait               Stairs            Wheelchair Mobility    Modified Rankin (Stroke Patients Only)       Balance                               Cognition Arousal/Alertness: Awake/alert Behavior During Therapy: WFL for tasks assessed/performed Overall Cognitive Status: Within Functional Limits for tasks assessed                      Exercises Total Joint Exercises Performed semi-supine B LE therapeutic exercise 2x10 reps:  Ankle pumps (AROM B LE's); quad sets x3 second holds (AROM B LE's); SAQ's (AAROM R; AROM L); heelslides (AAROM R; AROM L), hip abd/adduction (AAROM R; AROM L).  Pt required vc's and tactile cues for correct technique with exercises.     General Comments General comments (skin integrity, edema, etc.): R LE dressings in place  Pt agreeable to PT session with some encouragement.   Pt's family present during session.      Pertinent Vitals/Pain Pain Assessment: 0-10 Pain Score: 10-Worst pain ever Pain Location: R hip Pain Descriptors / Indicators: Aching;Sore;Tender;Operative site guarding Pain Intervention(s): Limited activity within patient's tolerance;Monitored during session;Repositioned (Nursing tech notified of pt's request for ice for operative LE)  Vitals stable and WFL throughout treatment session on 2 L/min O2 via nasal cannula.    Home Living Family/patient expects to be discharged to:: Private residence Living Arrangements: Children Available Help at Discharge: Family Type of Home: House Home Access: Stairs to enter Entrance Stairs-Rails: Right;Left;Can reach both Home Layout: One level Home Equipment: Cane - single point;Walker - 2 wheels;Walker - 4 wheels;Shower seat Additional Comments: Pt lives with her daughter.    Prior Function Level of Independence: Independent      Comments: Pt ambulating without AD.  Pt showers modified independently using shower chair.   PT Goals (current goals can now be found in the care plan section) Acute Rehab PT Goals Patient Stated Goal: to have less pain in R LE PT Goal Formulation: With patient Time For Goal Achievement: 04/19/16 Potential to Achieve Goals: Fair Progress towards PT goals: PT to reassess next treatment  Frequency  BID    PT Plan Current plan remains appropriate    Co-evaluation             End of Session Equipment Utilized During Treatment: Oxygen (2 L/min O2 via nasal cannula) Activity Tolerance: Patient limited by fatigue;Patient limited by pain Patient left: in bed;with call bell/phone within reach;with bed alarm set;with family/visitor present;with SCD's reapplied (B heels elevated via towel rolls)     Time: 1510-1535 PT Time Calculation (min) (ACUTE ONLY): 25 min  Charges:  $Therapeutic Exercise: 23-37 mins                    G CodesHendricks Limes 04-19-2016, 4:36 PM Hendricks Limes, PT [phone]

## 2016-04-05 NOTE — Progress Notes (Signed)
Patient with low B/P. Md notified, new orders written. Pt asymtomatic.

## 2016-04-05 NOTE — Progress Notes (Signed)
Administrative coordinator [facility]) called to attempt to obtain IV access in patient. AC attempted but no success. MD paged to see if patient still needs IV access. Waiting on callback.

## 2016-04-05 NOTE — Progress Notes (Signed)
MD notified of pt bladder scan results of after foley removed at 1030 this am with no void yet. MD ordered iv fluids and rescan in 4hrs.

## 2016-04-05 NOTE — Progress Notes (Signed)
Dr Amado Coe called back, states to bladder scan again in 2 hours, and in and out cath if needed (>340mL). Also, patient has no IV access, MD states to place a PICC.

## 2016-04-06 ENCOUNTER — Inpatient Hospital Stay: Payer: PPO

## 2016-04-06 LAB — GLUCOSE, CAPILLARY
GLUCOSE-CAPILLARY: 250 mg/dL — AB (ref 65–99)
GLUCOSE-CAPILLARY: 135 mg/dL — AB (ref 65–99)
GLUCOSE-CAPILLARY: 263 mg/dL — AB (ref 65–99)
Glucose-Capillary: 308 mg/dL — ABNORMAL HIGH (ref 65–99)

## 2016-04-06 LAB — CBC
HCT: 24.5 % — ABNORMAL LOW (ref 35.0–47.0)
Hemoglobin: 8.1 g/dL — ABNORMAL LOW (ref 12.0–16.0)
MCH: 30.1 pg (ref 26.0–34.0)
MCHC: 33.2 g/dL (ref 32.0–36.0)
MCV: 90.6 fL (ref 80.0–100.0)
PLATELETS: 142 10*3/uL — AB (ref 150–440)
RBC: 2.7 MIL/uL — ABNORMAL LOW (ref 3.80–5.20)
RDW: 13.1 % (ref 11.5–14.5)
WBC: 11 10*3/uL (ref 3.6–11.0)

## 2016-04-06 LAB — BASIC METABOLIC PANEL
Anion gap: 6 (ref 5–15)
BUN: 32 mg/dL — AB (ref 6–20)
CO2: 20 mmol/L — ABNORMAL LOW (ref 22–32)
CREATININE: 1.71 mg/dL — AB (ref 0.44–1.00)
Calcium: 7.5 mg/dL — ABNORMAL LOW (ref 8.9–10.3)
Chloride: 106 mmol/L (ref 101–111)
GFR calc Af Amer: 29 mL/min — ABNORMAL LOW (ref >60)
GFR, EST NON AFRICAN AMERICAN: 25 mL/min — AB (ref >60)
Glucose, Bld: 252 mg/dL — ABNORMAL HIGH (ref 65–99)
Potassium: 5 mmol/L (ref 3.5–5.1)
SODIUM: 132 mmol/L — AB (ref 135–145)

## 2016-04-06 LAB — URINALYSIS COMPLETE WITH MICROSCOPIC (ARMC ONLY)
Bilirubin Urine: NEGATIVE
GLUCOSE, UA: NEGATIVE mg/dL
Hgb urine dipstick: NEGATIVE
KETONES UR: NEGATIVE mg/dL
Nitrite: NEGATIVE
Protein, ur: NEGATIVE mg/dL
SPECIFIC GRAVITY, URINE: 1.017 (ref 1.005–1.030)
pH: 5 (ref 5.0–8.0)

## 2016-04-06 MED ORDER — GUAIFENESIN-DM 100-10 MG/5ML PO SYRP
10.0000 mL | ORAL_SOLUTION | ORAL | Status: DC | PRN
  Administered 2016-04-06: 10 mL via ORAL
  Filled 2016-04-06: qty 10

## 2016-04-06 NOTE — Anesthesia Postprocedure Evaluation (Signed)
Anesthesia Post Note  Patient: Sierra Warren  Procedure(s) Performed: Procedure(s) (LRB): INTRAMEDULLARY (IM) NAIL INTERTROCHANTRIC (Right)  Patient location during evaluation: Other Anesthesia Type: General Level of consciousness: awake and alert Pain management: pain level controlled Vital Signs Assessment: post-procedure vital signs reviewed and stable Respiratory status: spontaneous breathing, nonlabored ventilation, respiratory function stable and patient connected to nasal cannula oxygen Cardiovascular status: blood pressure returned to baseline and stable Postop Assessment: no signs of nausea or vomiting Anesthetic complications: no    Last Vitals:  Filed Vitals:   04/06/16 0800 04/06/16 1135  BP: 121/52 114/40  Pulse: 91   Temp: 36.7 C   Resp: 16     Last Pain:  Filed Vitals:   04/06/16 1153  PainSc: 3                  Lorian Yaun S

## 2016-04-06 NOTE — Progress Notes (Signed)
[facility] - [place] at [facility]   PATIENT NAME: Sierra Warren    MR#:  [id number]  DATE OF BIRTH:  January 11, 1921  SUBJECTIVE:  CHIEF COMPLAINT:   Chief Complaint  Patient presents with  . Fall   - Generalized weakness. Postoperative day 2 after her right hip surgery. -Blood pressure Improving with Solu-Cortef.-Daughter at bedside -Renal function with slight improvement. Still receiving IV fluids. Urine output is low normal  REVIEW OF SYSTEMS:  Review of Systems  Constitutional: Positive for malaise/fatigue. Negative for fever and chills.  HENT: Negative for ear discharge, ear pain and nosebleeds.   Eyes: Negative for blurred vision and double vision.  Respiratory: Negative for cough, shortness of breath and wheezing.   Cardiovascular: Negative for chest pain, palpitations and leg swelling.  Gastrointestinal: Negative for nausea, vomiting, abdominal pain, diarrhea and constipation.  Genitourinary: Negative for dysuria.       Decreased urine output  Musculoskeletal: Positive for myalgias and joint pain.  Neurological: Positive for weakness. Negative for dizziness, sensory change, speech change, focal weakness, seizures and headaches.  Psychiatric/Behavioral: Negative for depression.    DRUG ALLERGIES:   Allergies  Allergen Reactions  . Codeine Other (See Comments)    Upset stomach, diarrhea  . Penicillins Swelling    VITALS:  Blood pressure 114/40, pulse 91, temperature 98 F (36.7 C), temperature source Oral, resp. rate 16, height  (1.6 m), weight 78.019 kg (172 lb), SpO2 98 %.  PHYSICAL EXAMINATION:  Physical Exam  GENERAL:  82 y.o.-year-old patient lying in the bed with no acute distress.  EYES: Pupils equal, round, reactive to light and accommodation. No scleral icterus. Extraocular muscles intact.  HEENT: Head atraumatic, normocephalic. Oropharynx and nasopharynx clear.  NECK:  Supple, no jugular venous distention. No thyroid  enlargement, no tenderness.  LUNGS: Normal breath sounds bilaterally, no wheezing, rales,rhonchi or crepitation. No use of accessory muscles of respiration.  CARDIOVASCULAR: S1, S2 normal. No , rubs, or gallops. 3/6 systolic murmur present ABDOMEN: Soft, nontender, nondistended. Bowel sounds present. No organomegaly or mass.  EXTREMITIES: No pedal edema, cyanosis, or clubbing. Dressing of the right hip noted. NEUROLOGIC: Cranial nerves II through XII are intact. Muscle strength 5/5 in all extremities. Sensation intact. Gait not checked. Global weakness noted PSYCHIATRIC: The patient is alert and oriented x 3.  SKIN: No obvious rash, lesion, or ulcer.    LABORATORY PANEL:   CBC  Recent Labs Lab 04/06/16 0352  WBC 11.0  HGB 8.1*  HCT 24.5*  PLT 142*   ------------------------------------------------------------------------------------------------------------------  Chemistries   Recent Labs Lab 04/03/16 1419  04/06/16 0352  NA 132*  < > 132*  K 4.1  < > 5.0  CL 97*  < > 106  CO2 27  < > 20*  GLUCOSE 277*  < > 252*  BUN 23*  < > 32*  CREATININE 1.36*  < > 1.71*  CALCIUM 9.3  < > 7.5*  AST 27  --   --   ALT 22  --   --   ALKPHOS 114  --   --   BILITOT 0.5  --   --   < > = values in this interval not displayed. ------------------------------------------------------------------------------------------------------------------  Cardiac Enzymes  Recent Labs Lab 04/04/16 0444  TROPONINI <0.03   ------------------------------------------------------------------------------------------------------------------  RADIOLOGY:  Dg Chest 1 View  04/05/2016  CLINICAL DATA:  Right PICC line placement. Fell on 05/18 with hip surgery on 05/19. History of diabetes, thyroid disease, hypertension. EXAM: CHEST 1  VIEW COMPARISON:  04/04/2016 FINDINGS: Interval placement of a right PICC catheter with tip over the low SVC region. No pneumothorax. Normal heart size and pulmonary vascularity.  No focal airspace disease or consolidation in the lungs. No blunting of costophrenic angles. No pneumothorax. Mediastinal contours appear intact. Calcification of the aorta. Degenerative changes in the shoulders. Old left rib fractures. IMPRESSION: Right PICC line appears in satisfactory position. No evidence of active pulmonary disease. Electronically Signed   By: Burman Nieves M.D.   On: 04/05/2016 22:58   Dg Chest 2 View  04/06/2016  CLINICAL DATA:  Cough for 2 weeks EXAM: CHEST  2 VIEW COMPARISON:  Apr 05, 2016 FINDINGS: The right PICC line terminates in the SVC. The heart, hila, mediastinum, lungs, and pleura are unchanged. There is a small effusion appreciated on the lateral view. IMPRESSION: Small effusion seen on the lateral view. No other acute abnormalities. Electronically Signed   By: Gerome Sam III M.D   On: 04/06/2016 11:10   Dg Hip Operative Unilat W Or W/o Pelvis Right  04/04/2016  CLINICAL DATA:  Operative imaging for following ORIF of a right intertrochanteric proximal femur fracture EXAM: OPERATIVE RIGHT HIP (WITH PELVIS IF PERFORMED) 4 VIEWS TECHNIQUE: Fluoroscopic spot image(s) were submitted for interpretation post-operatively. COMPARISON:  04/03/2016 FINDINGS: Images show placement of an intra medullary rod supporting a compression screw reducing the intertrochanteric fracture components into near anatomic alignment. The orthopedic hardware is well-seated. There is no evidence of a new fracture or operative complication. IMPRESSION: Well-aligned fracture components following ORIF of the right intertrochanteric proximal femur fracture. Electronically Signed   By: Amie Portland M.D.   On: 04/04/2016 17:28    EKG:   Orders placed or performed during the hospital encounter of 04/03/16  . EKG 12-Lead  . EKG 12-Lead    ASSESSMENT AND PLAN:   82 year old female with past medical history significant for hypertension, diabetes, hypothyroidism who lives independently presented  to the hospital after a mechanical fall and right hip fracture.  #1 Right intertrochanteric hip fracture-appreciate orthopedics input. Postoperative day 2 after surgery - physical therapy, pain control and DVT prophylaxis  #2 Hypotension-since surgery. Not on any antihypertensives. No evidence of infection noted.  - Improving with Solu-Cortef. Wean steroids tomorrow. -Continue IV fluids. -Follow-up TSH and cortisol.  #3 ARF-on CKD. Baseline cr seems to be around 1.3. Creatinine worsened. Likely ATN from hypotension. -Slightly improved creatinine today -Improved blood pressures. Avoid nephrotoxins. Monitor creatinine -Renal ultrasound if continues to get worse. -Monitor urine output  #4 Anemia- acute on chronic Likely postsurgical. Hemoglobin at 8.1 today -Continue to monitor hemoglobin and transfuse the blood pressure is low and hemoglobin less than 8  #5 Hypothyroidism-continue Synthroid. Normal TSH  #6 DVT Prophylaxis- on Lovenox  Physical therapy consulted. Will need SNF at discharge   All the records are reviewed and case discussed with Care Management/Social Workerr. Management plans discussed with the patient, family and they are in agreement.  CODE STATUS: DNR  TOTAL TIME TAKING CARE OF THIS PATIENT: 37 minutes.   POSSIBLE D/C IN 1-2 DAYS, DEPENDING ON CLINICAL CONDITION.   Enid Baas M.D on 04/06/2016 at 12:04 PM  Between 7am to 6pm - Pager - [phone]  After 6pm go to [email or web address] - password [email or web address] [facility]  [place] [facility]  Office  [phone]  CC: Primary care physician; Danella Penton, MD

## 2016-04-06 NOTE — Progress Notes (Signed)
Physical Therapy Treatment Patient Details Name: Sierra Warren MRN: [id number] DOB: 09/20/1921 Today's Date: 04/06/2016    History of Present Illness Pt is a 82 y.o. female s/p mechanical fall sustaining acute R intertrochanteric fx.  Pt also with hypotension pre and post-op.  Pt s/p ORIF 04/04/16.  PMH includes htn and DM.    PT Comments    Pt fearful of moving but able to get to EOB then to chair with encouragement. Pt requiring Max A +2 for bed>chair transfer.  BP 140/58 upon sitting EOB and 114/40 after transfer to chair.  Continue with PT POC.   Follow Up Recommendations  SNF     Equipment Recommendations       Recommendations for Other Services       Precautions / Restrictions Precautions Precautions: Fall Restrictions Weight Bearing Restrictions: Yes RLE Weight Bearing: Weight bearing as tolerated    Mobility  Bed Mobility Overal bed mobility: Needs Assistance Bed Mobility: Supine to Sit           General bed mobility comments: Supine to sit with Mod A, HOB elevated, assist for R LE management and scooting towards EOB.  Transfers Overall transfer level: Needs assistance Equipment used: Rolling walker (2 wheeled) Transfers: Sit to/from Stand Sit to Stand: Max assist         General transfer comment: Sit<>stand x3 trials with improved extension into upright with   Ambulation/Gait Ambulation/Gait assistance: Max assist Ambulation Distance (Feet): 2 Feet Assistive device: Rolling walker (2 wheeled) Gait Pattern/deviations: Antalgic     General Gait Details: Bed to chair transfers wtih Max A +2 for safety; verbal cues for sequencing and to extend trunk; very labored and slow with decreased knee strength in B LE's   Stairs            Wheelchair Mobility    Modified Rankin (Stroke Patients Only)       Balance                                    Cognition Arousal/Alertness: Awake/alert Behavior During Therapy:  Anxious Overall Cognitive Status: Within Functional Limits for tasks assessed                      Exercises Total Joint Exercises Ankle Circles/Pumps: AROM;Strengthening;Both;10 reps;Supine Quad Sets: AROM;Strengthening;Both;10 reps;Supine Heel Slides: Strengthening;Both;10 reps;Supine Hip ABduction/ADduction: Strengthening;Both;10 reps;Supine    General Comments        Pertinent Vitals/Pain Pain Location: R hip (with  movement) Pain Intervention(s): Limited activity within patient's tolerance    Home Living                      Prior Function            PT Goals (current goals can now be found in the care plan section) Acute Rehab PT Goals Patient Stated Goal: to have less pain in R LE PT Goal Formulation: With patient Time For Goal Achievement: 04/19/16 Potential to Achieve Goals: Fair Progress towards PT goals: Progressing toward goals    Frequency  BID    PT Plan Current plan remains appropriate    Co-evaluation             End of Session Equipment Utilized During Treatment: Gait belt Activity Tolerance: Patient limited by fatigue;Patient limited by pain Patient left: in chair;with call bell/phone within reach;with chair alarm set;with family/visitor present  Time: 1101-1140 PT Time Calculation (min) (ACUTE ONLY): 39 min  Charges:  $Gait Training: 8-22 mins $Therapeutic Exercise: 8-22 mins $Therapeutic Activity: 8-22 mins                    G Codes:      Tola Meas A Vickey Ewbank, PT 05-02-16, 11:50 AM

## 2016-04-06 NOTE — Care Management Important Message (Signed)
Important Message  Patient Details  Name: VERDENE CRESON MRN: [id number] Date of Birth: [birth year]   Medicare Important Message Given:  Yes    Jerrion Tabbert A, RN 04/06/2016, 1:30 PM

## 2016-04-06 NOTE — Progress Notes (Signed)
Subjective: 2 Days Post-Op Procedure(s) (LRB): INTRAMEDULLARY (IM) NAIL INTERTROCHANTRIC (Right) Patient reports pain as mild.   Patient is well but having issues with hypotension. Plan is to go Skilled nursing facility after hospital stay. Negative for chest pain and shortness of breath Fever: no Gastrointestinal:Negative for nausea and vomiting  Objective: Vital signs in last 24 hours: Temp:  [97.6 F (36.4 C)-98.2 F (36.8 C)] 98 F (36.7 C) (05/21 0800) Pulse Rate:  [76-92] 91 (05/21 0800) Resp:  [16-18] 16 (05/21 0800) BP: (88-129)/(41-67) 121/52 mmHg (05/21 0800) SpO2:  [98 %-100 %] 98 % (05/21 0800)  Intake/Output from previous day:  Intake/Output Summary (Last 24 hours) at 04/06/16 0822 Last data filed at 04/06/16 0805  Gross per 24 hour  Intake 2946.25 ml  Output    275 ml  Net 2671.25 ml    Intake/Output this shift: Total I/O In: 306.3 [I.V.:306.3] Out: 0   Labs:  Recent Labs  04/03/16 1419 04/04/16 0444 04/05/16 0346 04/06/16 0352  HGB 12.2 10.2* 8.5* 8.1*    Recent Labs  04/05/16 0346 04/06/16 0352  WBC 10.3 11.0  RBC 2.86* 2.70*  HCT 26.0* 24.5*  PLT 143* 142*    Recent Labs  04/05/16 0346 04/06/16 0352  NA 134* 132*  K 4.7 5.0  CL 107 106  CO2 21* 20*  BUN 30* 32*  CREATININE 2.00* 1.71*  GLUCOSE 225* 252*  CALCIUM 7.2* 7.5*    Recent Labs  04/03/16 1419 04/03/16 1927  INR 1.01 1.10     EXAM General - Patient is Alert, Lacking and drowsy but arrousable Extremity - Neurologically intact ABD soft Sensation intact distally Incision: dressing C/D/I No cellulitis present Dressing/Incision - clean, dry, no drainage Motor Function - intact, moving foot and toes well on exam.   Abdomen is soft with normal BS, no tympany.  Past Medical History  Diagnosis Date  . Diabetes mellitus without complication (HCC)   . Thyroid disease   . Hypertension     Assessment/Plan: 2 Days Post-Op Procedure(s) (LRB): INTRAMEDULLARY  (IM) NAIL INTERTROCHANTRIC (Right) Active Problems:   Hip fracture (HCC)  Estimated body mass index is 30.48 kg/(m^2) as calculated from the following:   Height as of this encounter: 5\' 3"  (1.6 m).   Weight as of this encounter: 78.019 kg (172 lb). Advance diet Up with therapy   BP 121/52 this AM, improvement from yesterday. HR 91  Continue IVF Pt has not had a BM.  Fleet enema on board as well as suppositories. Labs reviewed, Hg 8.1 this AM, will continue to monitor for transfusion.  CBC and BMP ordered for tomorrow morning. Na 132 this AM, likely from IVF, will continue to run fluids due to hypotension Pt will likely need SNF placement, Care management to assist with discharge.  DVT Prophylaxis - Lovenox, Foot Pumps and TED hose Weight-Bearing as tolerated to right leg  J. Horris Latino, PA-C [facility] Orthopaedic [facility] 04/06/2016, 8:22 AM

## 2016-04-07 ENCOUNTER — Encounter: Payer: Self-pay | Admitting: Surgery

## 2016-04-07 LAB — CBC
HEMATOCRIT: 24.6 % — AB (ref 35.0–47.0)
HEMOGLOBIN: 8.1 g/dL — AB (ref 12.0–16.0)
MCH: 30.1 pg (ref 26.0–34.0)
MCHC: 33 g/dL (ref 32.0–36.0)
MCV: 91 fL (ref 80.0–100.0)
Platelets: 215 10*3/uL (ref 150–440)
RBC: 2.7 MIL/uL — ABNORMAL LOW (ref 3.80–5.20)
RDW: 13.4 % (ref 11.5–14.5)
WBC: 12.3 10*3/uL — ABNORMAL HIGH (ref 3.6–11.0)

## 2016-04-07 LAB — BASIC METABOLIC PANEL
Anion gap: 6 (ref 5–15)
BUN: 39 mg/dL — AB (ref 6–20)
CO2: 21 mmol/L — ABNORMAL LOW (ref 22–32)
CREATININE: 1.61 mg/dL — AB (ref 0.44–1.00)
Calcium: 8.3 mg/dL — ABNORMAL LOW (ref 8.9–10.3)
Chloride: 104 mmol/L (ref 101–111)
GFR calc Af Amer: 31 mL/min — ABNORMAL LOW (ref >60)
GFR calc non Af Amer: 27 mL/min — ABNORMAL LOW (ref >60)
GLUCOSE: 357 mg/dL — AB (ref 65–99)
Potassium: 5 mmol/L (ref 3.5–5.1)
Sodium: 131 mmol/L — ABNORMAL LOW (ref 135–145)

## 2016-04-07 LAB — GLUCOSE, CAPILLARY
GLUCOSE-CAPILLARY: 243 mg/dL — AB (ref 65–99)
GLUCOSE-CAPILLARY: 339 mg/dL — AB (ref 65–99)
GLUCOSE-CAPILLARY: 277 mg/dL — AB (ref 65–99)

## 2016-04-07 MED ORDER — LACTULOSE 10 GM/15ML PO SOLN
20.0000 g | ORAL | Status: AC
  Administered 2016-04-07: 20 g via ORAL
  Filled 2016-04-07: qty 30

## 2016-04-07 MED ORDER — ZOLPIDEM TARTRATE 5 MG PO TABS: 5.0000 mg | ORAL_TABLET | Freq: Every day | ORAL | Status: DC

## 2016-04-07 MED ORDER — DOCUSATE SODIUM 100 MG PO CAPS: 100.0000 mg | ORAL_CAPSULE | Freq: Two times a day (BID) | ORAL | Status: DC

## 2016-04-07 MED ORDER — OXYCODONE HCL 5 MG PO TABS: 5.0000 mg | ORAL_TABLET | ORAL | Status: DC | PRN

## 2016-04-07 MED ORDER — FERROUS SULFATE 325 (65 FE) MG PO TABS: 325.0000 mg | ORAL_TABLET | Freq: Two times a day (BID) | ORAL | Status: DC

## 2016-04-07 MED ORDER — SENNA 8.6 MG PO TABS
2.0000 | ORAL_TABLET | ORAL | Status: AC
  Administered 2016-04-07: 17.2 mg via ORAL

## 2016-04-07 MED ORDER — ENOXAPARIN SODIUM 30 MG/0.3ML ~~LOC~~ SOLN: 30.0000 mg | SUBCUTANEOUS | Status: DC

## 2016-04-07 MED ORDER — ALPRAZOLAM 0.5 MG PO TABS: 0.5000 mg | ORAL_TABLET | Freq: Two times a day (BID) | ORAL | Status: DC | PRN

## 2016-04-07 MED ORDER — SENNA 8.6 MG PO TABS: 2.0000 | ORAL_TABLET | Freq: Every day | ORAL | Status: DC

## 2016-04-07 NOTE — Progress Notes (Signed)
Discharge   Pt was given multiple medications (See MAR) for constipation with results this afternoon and MD approved discharge to [facility]. Report called to [facility] by this RN. PICC removed at 1500 with no complications. Pt and family informed that they must stay for an hour after this to monitor site. Pt complains of no pain at this time Family will take pt belongings. Rn will call and arrange transport to facility after 1600.

## 2016-04-07 NOTE — NC FL2 (Signed)
[place] MEDICAID FL2 LEVEL OF CARE SCREENING TOOL     IDENTIFICATION  Patient Name: Sierra Warren Birthdate: 1921-09-23 Sex: female Admission Date (Current Location): 04/03/2016  [place] and [place] Number:  Chiropodist and Address:  [facility], [address] [place], [place] [address]      Provider Number: [id number]  Attending Physician Name and Address:  Enid Baas, MD  Relative Name and Phone Number:       Current Level of Care: Hospital Recommended Level of Care: Skilled Nursing Facility Prior Approval Number:    Date Approved/Denied:   PASRR Number:  [id number])  Discharge Plan: SNF    Current Diagnoses: Patient Active Problem List   Diagnosis Date Noted  . Hip fracture (HCC) 04/03/2016    Orientation RESPIRATION BLADDER Height & Weight     Self, Time, Situation, Place  O2 (2 Liters Oxygen ) Continent Weight: 172 lb (78.019 kg) Height:   (160 cm)  BEHAVIORAL SYMPTOMS/MOOD NEUROLOGICAL BOWEL NUTRITION STATUS   (none )  (none ) Continent Regular Diet   AMBULATORY STATUS COMMUNICATION OF NEEDS Skin   Extensive Assist Verbally Surgical wounds                       Personal Care Assistance Level of Assistance  Bathing, Feeding, Dressing Bathing Assistance: Limited assistance Feeding assistance: Independent Dressing Assistance: Limited assistance     Functional Limitations Info  Hearing, Speech, Sight Sight Info: Adequate Hearing Info: Impaired Speech Info: Adequate    SPECIAL CARE FACTORS FREQUENCY  PT (By licensed PT), OT (By licensed OT)     PT Frequency:  (5) OT Frequency:  (5)            Contractures      Additional Factors Info  Code Status, Allergies Code Status Info:  (DNR ) Allergies Info:  (Codeine, Penicillins)           Current Medications (04/07/2016):  This is the current hospital active medication list Current Facility-Administered Medications  Medication Dose  Route Frequency Provider Last Rate Last Dose  . acetaminophen (TYLENOL) tablet 650 mg  650 mg Oral Q6H PRN Christena Flake, MD       Or  . acetaminophen (TYLENOL) suppository 650 mg  650 mg Rectal Q6H PRN Christena Flake, MD      . antiseptic oral rinse (CPC / CETYLPYRIDINIUM CHLORIDE 0.05%) solution 7 mL  7 mL Mouth Rinse BID Enedina Finner, MD   7 mL at 04/06/16 2132  . aspirin EC tablet 81 mg  81 mg Oral QODAY Christena Flake, MD   81 mg at 04/06/16 0929  . bisacodyl (DULCOLAX) suppository 10 mg  10 mg Rectal Daily PRN Christena Flake, MD   10 mg at 04/07/16 0840  . diphenhydrAMINE (BENADRYL) 12.5 MG/5ML elixir 12.5-25 mg  12.5-25 mg Oral Q4H PRN Christena Flake, MD      . docusate sodium (COLACE) capsule 100 mg  100 mg Oral BID Christena Flake, MD   100 mg at 04/06/16 2130  . enoxaparin (LOVENOX) injection 30 mg  30 mg Subcutaneous Q24H Christena Flake, MD   30 mg at 04/07/16 0847  . glimepiride (AMARYL) tablet 4 mg  4 mg Oral Daily Enedina Finner, MD   4 mg at 04/06/16 0929  . guaiFENesin-dextromethorphan (ROBITUSSIN DM) 100-10 MG/5ML syrup 10 mL  10 mL Oral Q4H PRN Enid Baas, MD   10 mL at 04/06/16  1024  . HYDROmorphone (DILAUDID) injection 0.25-0.5 mg  0.25-0.5 mg Intravenous Q2H PRN Christena Flake, MD      . insulin aspart (novoLOG) injection 0-15 Units  0-15 Units Subcutaneous TID WC Enedina Finner, MD   11 Units at 04/07/16 0840  . lactulose (CHRONULAC) 10 GM/15ML solution 20 g  20 g Oral STAT Enid Baas, MD      . levothyroxine (SYNTHROID, LEVOTHROID) tablet 100 mcg  100 mcg Oral QAC breakfast Enedina Finner, MD   100 mcg at 04/07/16 0847  . magnesium hydroxide (MILK OF MAGNESIA) suspension 30 mL  30 mL Oral Daily PRN Christena Flake, MD   30 mL at 04/06/16 0928  . metoCLOPramide (REGLAN) tablet 5-10 mg  5-10 mg Oral Q8H PRN Christena Flake, MD       Or  . metoCLOPramide (REGLAN) injection 5-10 mg  5-10 mg Intravenous Q8H PRN Christena Flake, MD      . ondansetron [facility]) tablet 4 mg  4 mg Oral Q6H PRN Christena Flake,  MD       Or  . ondansetron [facility]) injection 4 mg  4 mg Intravenous Q6H PRN Christena Flake, MD      . oxyCODONE (Oxy IR/ROXICODONE) immediate release tablet 5-10 mg  5-10 mg Oral Q3H PRN Christena Flake, MD   5 mg at 04/06/16 2440  . pregabalin (LYRICA) capsule 50 mg  50 mg Oral BID Enedina Finner, MD   50 mg at 04/06/16 2129  . senna (SENOKOT) tablet 17.2 mg  2 tablet Oral STAT Enid Baas, MD      . sodium phosphate (FLEET) 7-19 GM/118ML enema 1 enema  1 enema Rectal Once PRN Christena Flake, MD      . traMADol Janean Sark) tablet 50 mg  50 mg Oral Q4H PRN Christena Flake, MD   50 mg at 04/03/16 2040  . zolpidem (AMBIEN) tablet 5 mg  5 mg Oral QHS Enedina Finner, MD   5 mg at 04/06/16 2129     Discharge Medications: Please see discharge summary for a list of discharge medications.  Relevant Imaging Results:  Relevant Lab Results:   Additional Information  (SSN: [id number])  Haig Prophet, LCSW

## 2016-04-07 NOTE — Clinical Social Work Placement (Signed)
   CLINICAL SOCIAL WORK PLACEMENT  NOTE  Date:  04/07/2016  Patient Details  Name: Sierra Warren MRN: [id number] Date of Birth: 04/02/1921  Clinical Social Work is seeking post-discharge placement for this patient at the Skilled  Nursing Facility level of care (*CSW will initial, date and re-position this form in  chart as items are completed):  Yes   Patient/family provided with [facility] Clinical Social Work Department's list of facilities offering this level of care within the geographic area requested by the patient (or if unable, by the patient's family).  Yes   Patient/family informed of their freedom to choose among providers that offer the needed level of care, that participate in Medicare, Medicaid or managed care program needed by the patient, have an available bed and are willing to accept the patient.  Yes   Patient/family informed of [facility]'s ownership interest in [facility] and [facility], as well as of the fact that they are under no obligation to receive care at these facilities.  PASRR submitted to EDS on 04/04/16     PASRR number received on 04/04/16     Existing PASRR number confirmed on       FL2 transmitted to all facilities in geographic area requested by pt/family on 04/04/16     FL2 transmitted to all facilities within larger geographic area on       Patient informed that his/her managed care company has contracts with or will negotiate with certain facilities, including the following:        Yes   Patient/family informed of bed offers received.  Patient chooses bed at  ([facility] )     Physician recommends and patient chooses bed at      Patient to be transferred to  ([facility] ) on 04/07/16.  Patient to be transferred to facility by  [facility] )     Patient family notified on 04/07/16 of transfer.  Name of family member notified:   (Patient's daughter Kriste Basque is at bedside and aware of D/C today. )     PHYSICIAN       Additional  Comment:    _______________________________________________ Haig Prophet, LCSW 04/07/2016, 12:53 PM

## 2016-04-07 NOTE — Progress Notes (Signed)
Subjective: 3 Days Post-Op Procedure(s) (LRB): INTRAMEDULLARY (IM) NAIL INTERTROCHANTRIC (Right) Patient reports pain as 0 out of 10 in the right hip..   Patient is well, but has recently developed a cough Plan is to go Skilled nursing facility after hospital stay. Negative for chest pain and shortness of breath Fever: no Gastrointestinal:Negative for nausea and vomiting  Objective: Vital signs in last 24 hours: Temp:  [97.5 F (36.4 C)-98.4 F (36.9 C)] 97.5 F (36.4 C) (05/22 0737) Pulse Rate:  [78-106] 90 (05/22 0737) Resp:  [16-18] 18 (05/22 0737) BP: (103-151)/(40-86) 140/66 mmHg (05/22 0737) SpO2:  [98 %-99 %] 99 % (05/22 0737)  Intake/Output from previous day:  Intake/Output Summary (Last 24 hours) at 04/07/16 0751 Last data filed at 04/07/16 0432  Gross per 24 hour  Intake 646.25 ml  Output    575 ml  Net  71.25 ml    Intake/Output this shift:    Labs:  Recent Labs  04/05/16 0346 04/06/16 0352  HGB 8.5* 8.1*    Recent Labs  04/05/16 0346 04/06/16 0352  WBC 10.3 11.0  RBC 2.86* 2.70*  HCT 26.0* 24.5*  PLT 143* 142*    Recent Labs  04/05/16 0346 04/06/16 0352  NA 134* 132*  K 4.7 5.0  CL 107 106  CO2 21* 20*  BUN 30* 32*  CREATININE 2.00* 1.71*  GLUCOSE 225* 252*  CALCIUM 7.2* 7.5*   No results for input(s): LABPT, INR in the last 72 hours.   EXAM General - Patient is Alert, Lacking and drowsy but arrousable Extremity - Neurologically intact ABD soft Sensation intact distally Incision: scant drainage No cellulitis present Dressing/Incision - Mild bloody drainage from the most proximal incision site. Motor Function - intact, moving foot and toes well on exam.   Abdomen is soft with normal BS, no tympany.  Past Medical History  Diagnosis Date  . Diabetes mellitus without complication (HCC)   . Thyroid disease   . Hypertension     Assessment/Plan: 3 Days Post-Op Procedure(s) (LRB): INTRAMEDULLARY (IM) NAIL INTERTROCHANTRIC  (Right) Active Problems:   Hip fracture (HCC)  Estimated body mass index is 30.48 kg/(m^2) as calculated from the following:   Height as of this encounter:  (1.6 m).   Weight as of this encounter: 78.019 kg (172 lb). Advance diet Up with therapy   BP 140/66 this AM, improvement from yesterday. HR 90.  Pt has developed mild lung effusion likely from IVF per chest x-ray. Na 132, would recommend fluid restriction today due to hyponatremia as well as effusion. Pt has not had a BM.  Suppositories and FLEET enema on board for the patient. Labs reviewed, Hg 8.1 this AM, will continue to monitor for transfusion. Pt will likely need SNF placement, Care management to assist with discharge.  Pt is stable from a orthopaedic standpoint, will need to have a BM prior to discharge.  DVT Prophylaxis - Lovenox, Foot Pumps and TED hose Weight-Bearing as tolerated to right leg  J. Horris Latino, PA-C [facility] Orthopaedic Surgery 04/07/2016, 7:51 AM

## 2016-04-07 NOTE — Progress Notes (Signed)
Physical Therapy Treatment Patient Details Name: Sierra Warren MRN: [id number] DOB: 01/19/1921 Today's Date: 04/07/2016    History of Present Illness Pt is a 82 y.o. female s/p mechanical fall sustaining acute R intertrochanteric fx.  Pt also with hypotension pre and post-op.  Pt s/p ORIF 04/04/16.  PMH includes htn and DM.    PT Comments    Pt reluctantly agreeable to PT. Pt complains of severe R hip pain, non productive cough, wheezing, fatigue and general malaise. Pt noted to be fairly reclined in bed. Encouraged a more upright posture in bed, Max x 2 to reposition) with assisted abdominal bracing for more productive cough with education provided. Also encouraged use of incentive spirometer with education for improved lung expansion, as pt noted to be breathing shallow almost hyperventilating breaths with audible wheeze. Pt's O2 saturation good however, at 98%. Pt participated with supine bed exercises bilaterally requiring active assisted motion at least initially with most exercises. Pt will require continued instruction for carryover/compliance and technique. Pt scheduled for discharge currently to skilled nursing facility today to continue progression of range of motion and strengthening to improved all functional mobility.   Follow Up Recommendations  SNF     Equipment Recommendations       Recommendations for Other Services       Precautions / Restrictions Precautions Precautions: Fall Restrictions Weight Bearing Restrictions: Yes RLE Weight Bearing: Weight bearing as tolerated    Mobility  Bed Mobility Overal bed mobility: Needs Assistance Bed Mobility:  (repositioning upward in bed)           General bed mobility comments: use of trapeze and Max x 2 to reposition upward in bed  Transfers                 General transfer comment: Not tested due ot pain, cough, wheezing and anxiety  Ambulation/Gait                 Stairs             Wheelchair Mobility    Modified Rankin (Stroke Patients Only)       Balance                                    Cognition Arousal/Alertness: Awake/alert Behavior During Therapy: Anxious Overall Cognitive Status: Within Functional Limits for tasks assessed                      Exercises Total Joint Exercises Heel Slides:  (AROM on L) General Exercises - Lower Extremity Ankle Circles/Pumps: AROM;Both;20 reps;Supine Quad Sets: Strengthening;Both;20 reps;Supine Gluteal Sets: Strengthening;Both;20 reps;Supine Short Arc Quad: AAROM;Both;20 reps;Supine Heel Slides: AAROM;Right;20 reps;Supine (AROM partial range L) Hip ABduction/ADduction: AAROM;Both;20 reps;Supine Straight Leg Raises: AAROM;Both;10 reps;Supine Other Exercises Other Exercises: adductor squeeze 20 reps supine Other Exercises: deep pursed lip breathing Other Exercises: abdominal brace with couph as well as upright positioning Other Exercises: use of incentive spirometer    General Comments        Pertinent Vitals/Pain Pain Assessment: 0-10 Pain Score: 10-Worst pain ever (c/o non productive cough as well) Pain Location: R hip Pain Intervention(s): Limited activity within patient's tolerance;Monitored during session;Premedicated before session;Repositioned    Home Living                      Prior Function  PT Goals (current goals can now be found in the care plan section) Progress towards PT goals: Progressing toward goals (slowly)    Frequency  BID    PT Plan Current plan remains appropriate    Co-evaluation             End of Session   Activity Tolerance: Patient limited by fatigue;Patient limited by pain (limited by anxiety, wheezing and cough) Patient left: in bed;with call bell/phone within reach;with bed alarm set;with family/visitor present;with SCD's reapplied (CP to R hip)     Time: 2641-5830 PT Time Calculation (min) (ACUTE ONLY): 36  min  Charges:  $Therapeutic Exercise: 23-37 mins                    G Codes:      Kristeen Miss, PTA 04/07/2016, 1:05 PM

## 2016-04-07 NOTE — Progress Notes (Signed)
Patient is medically stable for D/C to [facility] today. Per Jomarie [facility] liaison patient will go to room 506. RN will call report to Elly Modena and arrange EMS for transport. Health Team authorization has been received. Auth # [id number]. Clinical Child psychotherapist (CSW) sent D/C Summary, FL2 and D/C Packet to [employer] via [employer]. Patient is aware of above. Patient's daughter Kriste Basque is at bedside and aware of above. Please reconsult if future social work needs arise. CSW signing off.   Jetta Lout, LCSW [phone]

## 2016-04-07 NOTE — Discharge Summary (Signed)
[facility] [place] at [facility]   PATIENT NAME: Sierra Warren    MR#:  [id number]  DATE OF BIRTH:  04/18/1921  DATE OF ADMISSION:  04/03/2016 ADMITTING PHYSICIAN: Christena Flake, MD  DATE OF DISCHARGE: 04/07/2016  PRIMARY CARE PHYSICIAN: Danella Penton, MD    ADMISSION DIAGNOSIS:  Intertrochanteric fracture of right hip, closed, initial encounter (HCC) [S72.141A]  DISCHARGE DIAGNOSIS:  Active Problems:   Hip fracture (HCC)   SECONDARY DIAGNOSIS:   Past Medical History  Diagnosis Date  . Diabetes mellitus without complication (HCC)   . Thyroid disease   . Hypertension     HOSPITAL COURSE:   82 year old female with past medical history significant for hypertension, diabetes, hypothyroidism who lives independently presented to the hospital after a mechanical fall and right hip fracture.  #1 Right intertrochanteric hip fracture-appreciate orthopedics input. Postoperative day 3 after surgery - physical therapy, pain control and DVT prophylaxis  #2 Hypotension-since surgery. Not on any antihypertensives. No evidence of infection noted.  - Improved with Solu-Cortef. Off steroids now -discontinue IV fluids. -normal TSH and follow up cortisol. -Sugars were elevated due to steroids.  #3 ARF-on CKD. Baseline cr seems to be around 1.3. Creatinine worsened. Likely ATN from hypotension. -Improving now -Improved blood pressures. Avoid nephrotoxins.   #4 Anemia- acute on chronic Likely postsurgical. Hemoglobin stable at 8.1 -iron supplements at discharge  #5 Hypothyroidism-continue Synthroid. Normal TSH  #6 DVT Prophylaxis- on Lovenox for 2 weeks after surgery  #7 diabetes mellitus-continue Amaryl and metformin. -Sugars were elevated due to steroids and now steroids are discontinued  Physical therapy consulted. Will need SNF at discharge Likely discharge today encourage incentive spirometry  DISCHARGE CONDITIONS:   guarded  CONSULTS  OBTAINED:  Treatment Team:  Christena Flake, MD  DRUG ALLERGIES:   Allergies  Allergen Reactions  . Codeine Other (See Comments)    Upset stomach, diarrhea  . Penicillins Swelling    DISCHARGE MEDICATIONS:   Current Discharge Medication List    START taking these medications   Details  docusate sodium (COLACE) 100 MG capsule Take 1 capsule (100 mg total) by mouth 2 (two) times daily. Qty: 10 capsule, Refills: 0    enoxaparin (LOVENOX) 30 MG/0.3ML injection Inject 0.3 mLs (30 mg total) into the skin daily. X 12 more days Qty: 3 mL, Refills: 0    ferrous sulfate (FERROUSUL) 325 (65 FE) MG tablet Take 1 tablet (325 mg total) by mouth 2 (two) times daily with a meal. Qty: 60 tablet, Refills: 3    oxyCODONE (OXY IR/ROXICODONE) 5 MG immediate release tablet Take 1-2 tablets (5-10 mg total) by mouth every 4 (four) hours as needed for moderate pain, severe pain or breakthrough pain. Qty: 30 tablet, Refills: 0    senna (SENOKOT) 8.6 MG TABS tablet Take 2 tablets (17.2 mg total) by mouth daily. Qty: 60 each, Refills: 0      CONTINUE these medications which have CHANGED   Details  ALPRAZolam (XANAX) 0.5 MG tablet Take 1 tablet (0.5 mg total) by mouth 2 (two) times daily as needed. Qty: 20 tablet, Refills: 0    zolpidem (AMBIEN) 5 MG tablet Take 1 tablet (5 mg total) by mouth at bedtime. Qty: 30 tablet, Refills: 0      CONTINUE these medications which have NOT CHANGED   Details  aspirin EC 81 MG tablet Take 1 tablet by mouth every other day.    glimepiride (AMARYL) 2 MG tablet Take 2 tablets by mouth  daily.    levothyroxine (SYNTHROID, LEVOTHROID) 100 MCG tablet Take 1 tablet by mouth daily.    LYRICA 50 MG capsule Take 1 capsule by mouth 2 (two) times daily.    metFORMIN (GLUCOPHAGE) 500 MG tablet Take 1 tablet by mouth daily.      STOP taking these medications     valsartan-hydrochlorothiazide (DIOVAN-HCT) 80-12.5 MG tablet          DISCHARGE INSTRUCTIONS:   1.  PCP f/u in 1-2 weeks 2. Incentive spirometry 3. Orthopedics f/u in 2 weeks  If you experience worsening of your admission symptoms, develop shortness of breath, life threatening emergency, suicidal or homicidal thoughts you must seek medical attention immediately by calling 911 or calling your MD immediately  if symptoms less severe.  You Must read complete instructions/literature along with all the possible adverse reactions/side effects for all the Medicines you take and that have been prescribed to you. Take any new Medicines after you have completely understood and accept all the possible adverse reactions/side effects.   Please note  You were cared for by a hospitalist during your hospital stay. If you have any questions about your discharge medications or the care you received while you were in the hospital after you are discharged, you can call the unit and asked to speak with the hospitalist on call if the hospitalist that took care of you is not available. Once you are discharged, your primary care physician will handle any further medical issues. Please note that NO REFILLS for any discharge medications will be authorized once you are discharged, as it is imperative that you return to your primary care physician (or establish a relationship with a primary care physician if you do not have one) for your aftercare needs so that they can reassess your need for medications and monitor your lab values.    Today   CHIEF COMPLAINT:   Chief Complaint  Patient presents with  . Fall    VITAL SIGNS:  Blood pressure 140/66, pulse 90, temperature 97.5 F (36.4 C), temperature source Oral, resp. rate 18, height 5\' 3"  (1.6 m), weight 78.019 kg (172 lb), SpO2 99 %.  I/O:   Intake/Output Summary (Last 24 hours) at 04/07/16 0947 Last data filed at 04/07/16 0432  Gross per 24 hour  Intake    340 ml  Output    575 ml  Net   -235 ml    PHYSICAL EXAMINATION:   Physical Exam  GENERAL: 82  y.o.-year-old patient lying in the bed with no acute distress.  EYES: Pupils equal, round, reactive to light and accommodation. No scleral icterus. Extraocular muscles intact.  HEENT: Head atraumatic, normocephalic. Oropharynx and nasopharynx clear.  NECK: Supple, no jugular venous distention. No thyroid enlargement, no tenderness.  LUNGS: Normal breath sounds bilaterally, scattered posterior basilar wheezing, no rales,rhonchi or crepitation. No use of accessory muscles of respiration.  CARDIOVASCULAR: S1, S2 normal. No , rubs, or gallops. 3/6 systolic murmur present ABDOMEN: Soft, nontender, nondistended. Bowel sounds present. No organomegaly or mass.  EXTREMITIES: No pedal edema, cyanosis, or clubbing. Dressing of the right hip noted. NEUROLOGIC: Cranial nerves II through XII are intact. Muscle strength 5/5 in all extremities. Sensation intact. Gait not checked. Global weakness noted PSYCHIATRIC: The patient is alert and oriented x 3.  SKIN: No obvious rash, lesion, or ulcer.   DATA REVIEW:   CBC  Recent Labs Lab 04/07/16 0851  WBC 12.3*  HGB 8.1*  HCT 24.6*  PLT 215  Chemistries   Recent Labs Lab 04/03/16 1419  04/07/16 0851  NA 132*  < > 131*  K 4.1  < > 5.0  CL 97*  < > 104  CO2 27  < > 21*  GLUCOSE 277*  < > 357*  BUN 23*  < > 39*  CREATININE 1.36*  < > 1.61*  CALCIUM 9.3  < > 8.3*  AST 27  --   --   ALT 22  --   --   ALKPHOS 114  --   --   BILITOT 0.5  --   --   < > = values in this interval not displayed.  Cardiac Enzymes  Recent Labs Lab 04/04/16 0444  TROPONINI <0.03    Microbiology Results  Results for orders placed or performed during the hospital encounter of 04/03/16  Surgical pcr screen     Status: None   Collection Time: 04/04/16  3:20 AM  Result Value Ref Range Status   MRSA, PCR NEGATIVE NEGATIVE Final   Staphylococcus aureus NEGATIVE NEGATIVE Final    Comment:        The Xpert SA Assay (FDA approved for NASAL specimens in  patients over 46 years of age), is one component of a comprehensive surveillance program.  Test performance has been validated by [facility] for patients greater than or equal to 59 year old. It is not intended to diagnose infection nor to guide or monitor treatment.     RADIOLOGY:  Dg Chest 1 View  04/05/2016  CLINICAL DATA:  Right PICC line placement. Fell on 05/18 with hip surgery on 05/19. History of diabetes, thyroid disease, hypertension. EXAM: CHEST 1 VIEW COMPARISON:  04/04/2016 FINDINGS: Interval placement of a right PICC catheter with tip over the low SVC region. No pneumothorax. Normal heart size and pulmonary vascularity. No focal airspace disease or consolidation in the lungs. No blunting of costophrenic angles. No pneumothorax. Mediastinal contours appear intact. Calcification of the aorta. Degenerative changes in the shoulders. Old left rib fractures. IMPRESSION: Right PICC line appears in satisfactory position. No evidence of active pulmonary disease. Electronically Signed   By: Burman Nieves M.D.   On: 04/05/2016 22:58   Dg Chest 2 View  04/06/2016  CLINICAL DATA:  Cough for 2 weeks EXAM: CHEST  2 VIEW COMPARISON:  Apr 05, 2016 FINDINGS: The right PICC line terminates in the SVC. The heart, hila, mediastinum, lungs, and pleura are unchanged. There is a small effusion appreciated on the lateral view. IMPRESSION: Small effusion seen on the lateral view. No other acute abnormalities. Electronically Signed   By: Gerome Sam III M.D   On: 04/06/2016 11:10    EKG:   Orders placed or performed during the hospital encounter of 04/03/16  . EKG 12-Lead  . EKG 12-Lead      Management plans discussed with the patient, family and they are in agreement.  CODE STATUS:     Code Status Orders        Start     Ordered   04/04/16 0910  Do not attempt resuscitation (DNR)   Continuous    Question Answer Comment  In the event of cardiac or respiratory ARREST Do not call a  "code blue"   In the event of cardiac or respiratory ARREST Do not perform Intubation, CPR, defibrillation or ACLS   In the event of cardiac or respiratory ARREST Use medication by any route, position, wound care, and other measures to relive pain and suffering. May use  oxygen, suction and manual treatment of airway obstruction as needed for comfort.      04/04/16 4098    Code Status History    Date Active Date Inactive Code Status Order ID Comments User Context   04/03/2016  5:35 PM 04/04/2016  9:09 AM Full Code [id number]  Enedina Finner, MD Inpatient      TOTAL TIME TAKING CARE OF THIS PATIENT: 38 minutes.    Enid Baas M.D on 04/07/2016 at 9:47 AM  Between 7am to 6pm - Pager - [phone]  After 6pm go to [email or web address] - password [email or web address] [facility]  [place] [facility]  Office  [phone]  CC: Primary care physician; Danella Penton, MD

## 2016-04-07 NOTE — Progress Notes (Signed)
[facility] - [place] at [facility]   PATIENT NAME: Sierra Warren    MR#:  [id number]  DATE OF BIRTH:  Oct 06, 1921  SUBJECTIVE:  CHIEF COMPLAINT:   Chief Complaint  Patient presents with  . Fall   - Generalized weakness. Postoperative day 3 after her right hip surgery. -Blood pressure is much better now. Voiding well. Labs are pending this morning. -Hasn't had a bowel movement yet. Complains of cough.  REVIEW OF SYSTEMS:  Review of Systems  Constitutional: Negative for fever, chills and malaise/fatigue.  HENT: Negative for ear discharge, ear pain and nosebleeds.   Eyes: Negative for blurred vision and double vision.  Respiratory: Positive for cough and shortness of breath. Negative for wheezing.   Cardiovascular: Negative for chest pain, palpitations and leg swelling.  Gastrointestinal: Negative for nausea, vomiting, abdominal pain, diarrhea and constipation.  Genitourinary: Negative for dysuria.       Decreased urine output  Musculoskeletal: Positive for myalgias and joint pain.  Neurological: Positive for weakness. Negative for dizziness, sensory change, speech change, focal weakness, seizures and headaches.  Psychiatric/Behavioral: Negative for depression.    DRUG ALLERGIES:   Allergies  Allergen Reactions  . Codeine Other (See Comments)    Upset stomach, diarrhea  . Penicillins Swelling    VITALS:  Blood pressure 140/66, pulse 90, temperature 97.5 F (36.4 C), temperature source Oral, resp. rate 18, height 5\' 3"  (1.6 m), weight 78.019 kg (172 lb), SpO2 99 %.  PHYSICAL EXAMINATION:  Physical Exam  GENERAL:  82 y.o.-year-old patient lying in the bed with no acute distress.  EYES: Pupils equal, round, reactive to light and accommodation. No scleral icterus. Extraocular muscles intact.  HEENT: Head atraumatic, normocephalic. Oropharynx and nasopharynx clear.  NECK:  Supple, no jugular venous distention. No thyroid enlargement, no tenderness.   LUNGS: Normal breath sounds bilaterally, scattered posterior basilar wheezing, no rales,rhonchi or crepitation. No use of accessory muscles of respiration.  CARDIOVASCULAR: S1, S2 normal. No , rubs, or gallops. 3/6 systolic murmur present ABDOMEN: Soft, nontender, nondistended. Bowel sounds present. No organomegaly or mass.  EXTREMITIES: No pedal edema, cyanosis, or clubbing. Dressing of the right hip noted. NEUROLOGIC: Cranial nerves II through XII are intact. Muscle strength 5/5 in all extremities. Sensation intact. Gait not checked. Global weakness noted PSYCHIATRIC: The patient is alert and oriented x 3.  SKIN: No obvious rash, lesion, or ulcer.    LABORATORY PANEL:   CBC  Recent Labs Lab 04/07/16 0851  WBC 12.3*  HGB 8.1*  HCT 24.6*  PLT 215   ------------------------------------------------------------------------------------------------------------------  Chemistries   Recent Labs Lab 04/03/16 1419  04/07/16 0851  NA 132*  < > 131*  K 4.1  < > 5.0  CL 97*  < > 104  CO2 27  < > 21*  GLUCOSE 277*  < > 357*  BUN 23*  < > 39*  CREATININE 1.36*  < > 1.61*  CALCIUM 9.3  < > 8.3*  AST 27  --   --   ALT 22  --   --   ALKPHOS 114  --   --   BILITOT 0.5  --   --   < > = values in this interval not displayed. ------------------------------------------------------------------------------------------------------------------  Cardiac Enzymes  Recent Labs Lab 04/04/16 0444  TROPONINI <0.03   ------------------------------------------------------------------------------------------------------------------  RADIOLOGY:  Dg Chest 1 View  04/05/2016  CLINICAL DATA:  Right PICC line placement. Fell on 05/18 with hip surgery on 05/19. History of diabetes, thyroid disease,  hypertension. EXAM: CHEST 1 VIEW COMPARISON:  04/04/2016 FINDINGS: Interval placement of a right PICC catheter with tip over the low SVC region. No pneumothorax. Normal heart size and pulmonary vascularity.  No focal airspace disease or consolidation in the lungs. No blunting of costophrenic angles. No pneumothorax. Mediastinal contours appear intact. Calcification of the aorta. Degenerative changes in the shoulders. Old left rib fractures. IMPRESSION: Right PICC line appears in satisfactory position. No evidence of active pulmonary disease. Electronically Signed   By: Burman Nieves M.D.   On: 04/05/2016 22:58   Dg Chest 2 View  04/06/2016  CLINICAL DATA:  Cough for 2 weeks EXAM: CHEST  2 VIEW COMPARISON:  Apr 05, 2016 FINDINGS: The right PICC line terminates in the SVC. The heart, hila, mediastinum, lungs, and pleura are unchanged. There is a small effusion appreciated on the lateral view. IMPRESSION: Small effusion seen on the lateral view. No other acute abnormalities. Electronically Signed   By: Gerome Sam III M.D   On: 04/06/2016 11:10    EKG:   Orders placed or performed during the hospital encounter of 04/03/16  . EKG 12-Lead  . EKG 12-Lead    ASSESSMENT AND PLAN:   82 year old female with past medical history significant for hypertension, diabetes, hypothyroidism who lives independently presented to the hospital after a mechanical fall and right hip fracture.  #1 Right intertrochanteric hip fracture-appreciate orthopedics input. Postoperative day 3 after surgery - physical therapy, pain control and DVT prophylaxis  #2 Hypotension-since surgery. Not on any antihypertensives. No evidence of infection noted.  - Improved with Solu-Cortef. Off steroids now -discontinue IV fluids. -normal  TSH and follow up cortisol.  #3 ARF-on CKD. Baseline cr seems to be around 1.3. Creatinine worsened. Likely ATN from hypotension. -Improving now -Improved blood pressures. Avoid nephrotoxins.   #4 Anemia- acute on chronic Likely postsurgical. Hemoglobin stable at 8.1 -iron supplements at discharge  #5 Hypothyroidism-continue Synthroid. Normal TSH  #6 DVT Prophylaxis- on Lovenox  Physical  therapy consulted. Will need SNF at discharge Likely discharge today encourage incentive spirometry   All the records are reviewed and case discussed with Care Management/Social Workerr. Management plans discussed with the patient, family and they are in agreement.  CODE STATUS: DNR  TOTAL TIME TAKING CARE OF THIS PATIENT: 37 minutes.   POSSIBLE D/C TODAY, DEPENDING ON CLINICAL CONDITION.   Enid Baas M.D on 04/07/2016 at 9:39 AM  Between 7am to 6pm - Pager - [phone]  After 6pm go to [email or web address] - password [email or web address] [facility]  [place]  Office  [phone]  CC: Primary care physician; Danella Penton, MD

## 2016-05-23 ENCOUNTER — Inpatient Hospital Stay
Admission: EM | Admit: 2016-05-23 | Discharge: 2016-05-27 | DRG: 640 | Disposition: A | Discharge status: Hospice | Payer: PPO | Attending: Internal Medicine | Admitting: Internal Medicine

## 2016-05-23 DIAGNOSIS — I2510 Atherosclerotic heart disease of native coronary artery without angina pectoris: Secondary | ICD-10-CM | POA: Diagnosis present

## 2016-05-23 DIAGNOSIS — E11649 Type 2 diabetes mellitus with hypoglycemia without coma: Secondary | ICD-10-CM | POA: Diagnosis present

## 2016-05-23 DIAGNOSIS — R0902 Hypoxemia: Secondary | ICD-10-CM

## 2016-05-23 DIAGNOSIS — Z66 Do not resuscitate: Secondary | ICD-10-CM | POA: Diagnosis not present

## 2016-05-23 DIAGNOSIS — Z79891 Long term (current) use of opiate analgesic: Secondary | ICD-10-CM | POA: Diagnosis not present

## 2016-05-23 DIAGNOSIS — K219 Gastro-esophageal reflux disease without esophagitis: Secondary | ICD-10-CM | POA: Diagnosis present

## 2016-05-23 DIAGNOSIS — E119 Type 2 diabetes mellitus without complications: Secondary | ICD-10-CM

## 2016-05-23 DIAGNOSIS — E039 Hypothyroidism, unspecified: Secondary | ICD-10-CM

## 2016-05-23 DIAGNOSIS — R600 Localized edema: Secondary | ICD-10-CM | POA: Diagnosis present

## 2016-05-23 DIAGNOSIS — G35 Multiple sclerosis: Secondary | ICD-10-CM | POA: Diagnosis present

## 2016-05-23 DIAGNOSIS — Z993 Dependence on wheelchair: Secondary | ICD-10-CM | POA: Diagnosis not present

## 2016-05-23 DIAGNOSIS — Z880 Allergy status to penicillin: Secondary | ICD-10-CM

## 2016-05-23 DIAGNOSIS — Z7982 Long term (current) use of aspirin: Secondary | ICD-10-CM | POA: Diagnosis not present

## 2016-05-23 DIAGNOSIS — Z515 Encounter for palliative care: Secondary | ICD-10-CM | POA: Diagnosis not present

## 2016-05-23 DIAGNOSIS — E785 Hyperlipidemia, unspecified: Secondary | ICD-10-CM | POA: Diagnosis present

## 2016-05-23 DIAGNOSIS — R52 Pain, unspecified: Secondary | ICD-10-CM | POA: Insufficient documentation

## 2016-05-23 DIAGNOSIS — E861 Hypovolemia: Secondary | ICD-10-CM | POA: Diagnosis present

## 2016-05-23 DIAGNOSIS — E43 Unspecified severe protein-calorie malnutrition: Secondary | ICD-10-CM | POA: Diagnosis present

## 2016-05-23 DIAGNOSIS — R339 Retention of urine, unspecified: Secondary | ICD-10-CM

## 2016-05-23 DIAGNOSIS — Z79899 Other long term (current) drug therapy: Secondary | ICD-10-CM

## 2016-05-23 DIAGNOSIS — I10 Essential (primary) hypertension: Secondary | ICD-10-CM

## 2016-05-23 DIAGNOSIS — E871 Hypo-osmolality and hyponatremia: Secondary | ICD-10-CM | POA: Diagnosis not present

## 2016-05-23 DIAGNOSIS — N390 Urinary tract infection, site not specified: Secondary | ICD-10-CM | POA: Diagnosis present

## 2016-05-23 DIAGNOSIS — Z885 Allergy status to narcotic agent status: Secondary | ICD-10-CM

## 2016-05-23 DIAGNOSIS — K5900 Constipation, unspecified: Secondary | ICD-10-CM | POA: Diagnosis present

## 2016-05-23 LAB — BASIC METABOLIC PANEL
Anion gap: 5 (ref 5–15)
BUN: 15 mg/dL (ref 6–20)
CO2: 35 mmol/L — ABNORMAL HIGH (ref 22–32)
Calcium: 7.9 mg/dL — ABNORMAL LOW (ref 8.9–10.3)
Chloride: 79 mmol/L — ABNORMAL LOW (ref 101–111)
Creatinine, Ser: 0.72 mg/dL (ref 0.44–1.00)
GFR calc Af Amer: >60 mL/min (ref >60)
GLUCOSE: 102 mg/dL — AB (ref 65–99)
POTASSIUM: 4 mmol/L (ref 3.5–5.1)
SODIUM: 119 mmol/L — AB (ref 135–145)

## 2016-05-23 LAB — URINALYSIS COMPLETE WITH MICROSCOPIC (ARMC ONLY)
BILIRUBIN URINE: NEGATIVE
GLUCOSE, UA: NEGATIVE mg/dL
KETONES UR: NEGATIVE mg/dL
NITRITE: POSITIVE — AB
PH: 6 (ref 5.0–8.0)
Protein, ur: NEGATIVE mg/dL
SPECIFIC GRAVITY, URINE: 1.008 (ref 1.005–1.030)
Squamous Epithelial / LPF: NONE SEEN

## 2016-05-23 LAB — COMPREHENSIVE METABOLIC PANEL
ALT: 16 U/L (ref 14–54)
AST: 22 U/L (ref 15–41)
Albumin: 2.8 g/dL — ABNORMAL LOW (ref 3.5–5.0)
Alkaline Phosphatase: 157 U/L — ABNORMAL HIGH (ref 38–126)
Anion gap: 8 (ref 5–15)
BUN: 16 mg/dL (ref 6–20)
CHLORIDE: 73 mmol/L — AB (ref 101–111)
CO2: 37 mmol/L — AB (ref 22–32)
CREATININE: 0.86 mg/dL (ref 0.44–1.00)
Calcium: 8.5 mg/dL — ABNORMAL LOW (ref 8.9–10.3)
GFR calc non Af Amer: 57 mL/min — ABNORMAL LOW (ref >60)
Glucose, Bld: 124 mg/dL — ABNORMAL HIGH (ref 65–99)
POTASSIUM: 4.2 mmol/L (ref 3.5–5.1)
SODIUM: 118 mmol/L — AB (ref 135–145)
Total Bilirubin: 0.6 mg/dL (ref 0.3–1.2)
Total Protein: 5.9 g/dL — ABNORMAL LOW (ref 6.5–8.1)

## 2016-05-23 LAB — CBC
HCT: 32.3 % — ABNORMAL LOW (ref 35.0–47.0)
Hemoglobin: 11.1 g/dL — ABNORMAL LOW (ref 12.0–16.0)
MCH: 29.7 pg (ref 26.0–34.0)
MCHC: 34.3 g/dL (ref 32.0–36.0)
MCV: 86.4 fL (ref 80.0–100.0)
PLATELETS: 274 10*3/uL (ref 150–440)
RBC: 3.73 MIL/uL — AB (ref 3.80–5.20)
RDW: 15.1 % — AB (ref 11.5–14.5)
WBC: 8 10*3/uL (ref 3.6–11.0)

## 2016-05-23 LAB — MAGNESIUM: Magnesium: 1.6 mg/dL — ABNORMAL LOW (ref 1.7–2.4)

## 2016-05-23 LAB — OSMOLALITY: OSMOLALITY: 252 mosm/kg — AB (ref 275–295)

## 2016-05-23 LAB — MRSA PCR SCREENING: MRSA by PCR: NEGATIVE

## 2016-05-23 LAB — SODIUM, URINE, RANDOM

## 2016-05-23 LAB — GLUCOSE, CAPILLARY: Glucose-Capillary: 117 mg/dL — ABNORMAL HIGH (ref 65–99)

## 2016-05-23 LAB — OSMOLALITY, URINE: Osmolality, Ur: 180 mOsm/kg — ABNORMAL LOW (ref 300–900)

## 2016-05-23 MED ORDER — GLIMEPIRIDE 2 MG PO TABS: 4.0000 mg | ORAL_TABLET | Freq: Every day | ORAL | Status: DC

## 2016-05-23 MED ORDER — LEVOFLOXACIN IN D5W 500 MG/100ML IV SOLN
500.0000 mg | Freq: Once | INTRAVENOUS | Status: AC
  Administered 2016-05-23: 500 mg via INTRAVENOUS
  Filled 2016-05-23: qty 100

## 2016-05-23 MED ORDER — INSULIN ASPART 100 UNIT/ML ~~LOC~~ SOLN
0.0000 [IU] | Freq: Three times a day (TID) | SUBCUTANEOUS | Status: DC
  Administered 2016-05-24 – 2016-05-25 (×2): 3 [IU] via SUBCUTANEOUS
  Administered 2016-05-25: 5 [IU] via SUBCUTANEOUS
  Administered 2016-05-25 – 2016-05-26 (×3): 3 [IU] via SUBCUTANEOUS
  Administered 2016-05-27: 08:00:00 5 [IU] via SUBCUTANEOUS
  Filled 2016-05-23 (×5): qty 3
  Filled 2016-05-23 (×2): qty 5

## 2016-05-23 MED ORDER — METFORMIN HCL 500 MG PO TABS: 500.0000 mg | ORAL_TABLET | Freq: Every day | ORAL | Status: DC

## 2016-05-23 MED ORDER — LEVOFLOXACIN IN D5W 250 MG/50ML IV SOLN
250.0000 mg | INTRAVENOUS | Status: DC
  Administered 2016-05-24: 250 mg via INTRAVENOUS
  Filled 2016-05-23: qty 50

## 2016-05-23 MED ORDER — SODIUM CHLORIDE 0.9% FLUSH
3.0000 mL | Freq: Two times a day (BID) | INTRAVENOUS | Status: DC
  Administered 2016-05-23: 22:00:00 3 mL via INTRAVENOUS

## 2016-05-23 MED ORDER — ENOXAPARIN SODIUM 30 MG/0.3ML ~~LOC~~ SOLN
40.0000 mg | SUBCUTANEOUS | Status: DC
  Administered 2016-05-26: 40 mg via SUBCUTANEOUS
  Filled 2016-05-23: qty 0.6

## 2016-05-23 MED ORDER — SODIUM CHLORIDE 0.9 % IV SOLN
Freq: Once | INTRAVENOUS | Status: AC
  Administered 2016-05-23: 19:00:00 via INTRAVENOUS

## 2016-05-23 MED ORDER — ACETAMINOPHEN 325 MG PO TABS
650.0000 mg | ORAL_TABLET | Freq: Four times a day (QID) | ORAL | Status: DC | PRN
  Administered 2016-05-26: 06:00:00 650 mg via ORAL
  Filled 2016-05-23 (×2): qty 2

## 2016-05-23 MED ORDER — ONDANSETRON HCL 4 MG/2ML IJ SOLN
4.0000 mg | Freq: Four times a day (QID) | INTRAMUSCULAR | Status: DC | PRN
  Administered 2016-05-26: 18:00:00 4 mg via INTRAVENOUS
  Filled 2016-05-23: qty 2

## 2016-05-23 MED ORDER — SODIUM CHLORIDE 0.9 % IV BOLUS (SEPSIS)
1000.0000 mL | Freq: Once | INTRAVENOUS | Status: AC
Start: 2016-05-23 — End: 2016-05-23
  Administered 2016-05-23: 1000 mL via INTRAVENOUS

## 2016-05-23 MED ORDER — INSULIN ASPART 100 UNIT/ML ~~LOC~~ SOLN
0.0000 [IU] | Freq: Every day | SUBCUTANEOUS | Status: DC
  Administered 2016-05-24: 22:00:00 2 [IU] via SUBCUTANEOUS
  Filled 2016-05-23: qty 2

## 2016-05-23 MED ORDER — LEVOTHYROXINE SODIUM 100 MCG PO TABS
100.0000 ug | ORAL_TABLET | Freq: Every day | ORAL | Status: DC
  Administered 2016-05-25 – 2016-05-27 (×3): 100 ug via ORAL
  Filled 2016-05-23 (×3): qty 1

## 2016-05-23 MED ORDER — ONDANSETRON HCL 4 MG PO TABS
4.0000 mg | ORAL_TABLET | Freq: Four times a day (QID) | ORAL | Status: DC | PRN
  Filled 2016-05-23: qty 1

## 2016-05-23 MED ORDER — SODIUM CHLORIDE 0.9 % IV SOLN
INTRAVENOUS | Status: DC
  Administered 2016-05-23: 22:00:00 via INTRAVENOUS

## 2016-05-23 MED ORDER — ACETAMINOPHEN 650 MG RE SUPP: 650.0000 mg | Freq: Four times a day (QID) | RECTAL | Status: DC | PRN

## 2016-05-23 NOTE — Progress Notes (Signed)
Critical sodium called Md aware improving slightly

## 2016-05-23 NOTE — ED Notes (Signed)
Per EMS [facility] said that her labs were abnormal and the MD there would not give them an order for IV fluids to replenish so sent to ED for eval for IV fluids

## 2016-05-23 NOTE — ED Notes (Addendum)
Lab called with critical NA level of 118 - reported to Dr Lenard Lance - immediately at time of cath had 1500cc of urine return - cath clamped for 30 min to one hour - MD aware

## 2016-05-23 NOTE — H&P (Signed)
PCP:   Danella Penton, MD   Chief Complaint:  Hyponatremia  HPI: This is a 82 year old female who recently sustained a femur fracture and underwent an ORIF. She was transferred to nursing home for rehabilitation which has been unsuccessful. She is mostly wheelchair-bound. The patient appears to be quite depressed per family member reports. She has decreased appetite, was recently placed and nutritional supplements and appears disinclined to be interactive. She has been weak. Laboratory studies done outpatient revealed hyponatremia. She was sent to the ER. In the ER blood work done revealed a sodium of 118. Per daughter's the patient had been hyponatremic before in the rehabilitation facility, at that time which was treated with fluid restriction. All history provided by the patient's daughters, present at bedside. The patient is arousable but not particularly interactive.  Review of Systems:  The patient denies anorexia, fever, weight loss,, vision loss, decreased hearing, hoarseness, chest pain, syncope, dyspnea on exertion, peripheral edema, balance deficits, hemoptysis, abdominal pain, melena, hematochezia, severe indigestion/heartburn, hematuria, incontinence, genital sores, weakness, suspicious skin lesions, transient blindness, difficulty walking, depression, unusual weight change, abnormal bleeding, enlarged lymph nodes, angioedema, and breast masses.  Past Medical History: Past Medical History  Diagnosis Date  . Diabetes mellitus without complication (HCC)   . Thyroid disease   . Hypertension    Past Surgical History  Procedure Laterality Date  . Cholecystectomy    . Abdominal hysterectomy    . Small intestine surgery    . Eye surgery Bilateral     cataracts  . Intramedullary (im) nail intertrochanteric Right 04/04/2016    Procedure: INTRAMEDULLARY (IM) NAIL INTERTROCHANTRIC;  Surgeon: Christena Flake, MD;  Location: [facility] ORS;  Service: Orthopedics;  Laterality: Right;     Medications: Prior to Admission medications   Medication Sig Start Date End Date Taking? Authorizing Provider  ALPRAZolam Prudy Feeler) 0.5 MG tablet Take 1 tablet (0.5 mg total) by mouth 2 (two) times daily as needed. 04/07/16   Enid Baas, MD  aspirin EC 81 MG tablet Take 1 tablet by mouth every other day. 07/27/12   Historical Provider, MD  docusate sodium (COLACE) 100 MG capsule Take 1 capsule (100 mg total) by mouth 2 (two) times daily. 04/07/16   Enid Baas, MD  enoxaparin (LOVENOX) 30 MG/0.3ML injection Inject 0.3 mLs (30 mg total) into the skin daily. X 12 more days 04/07/16   Enid Baas, MD  ferrous sulfate (FERROUSUL) 325 (65 FE) MG tablet Take 1 tablet (325 mg total) by mouth 2 (two) times daily with a meal. 04/07/16   Enid Baas, MD  glimepiride (AMARYL) 2 MG tablet Take 2 tablets by mouth daily. 03/04/16   Historical Provider, MD  levothyroxine (SYNTHROID, LEVOTHROID) 100 MCG tablet Take 1 tablet by mouth daily. 03/04/16   Historical Provider, MD  LYRICA 50 MG capsule Take 1 capsule by mouth 2 (two) times daily. 03/14/16   Historical Provider, MD  metFORMIN (GLUCOPHAGE) 500 MG tablet Take 1 tablet by mouth daily. 02/12/16   Historical Provider, MD  oxyCODONE (OXY IR/ROXICODONE) 5 MG immediate release tablet Take 1-2 tablets (5-10 mg total) by mouth every 4 (four) hours as needed for moderate pain, severe pain or breakthrough pain. 04/07/16   Enid Baas, MD  senna (SENOKOT) 8.6 MG TABS tablet Take 2 tablets (17.2 mg total) by mouth daily. 04/07/16   Enid Baas, MD  zolpidem (AMBIEN) 5 MG tablet Take 1 tablet (5 mg total) by mouth at bedtime. 04/07/16   Enid Baas, MD  Allergies:   Allergies  Allergen Reactions  . Codeine Other (See Comments)    Upset stomach, diarrhea  . Penicillins Swelling    Social History:  reports that she has never smoked. She does not have any smokeless tobacco history on file. She reports that she does not drink  alcohol or use illicit drugs.  Family History: Diabetes mellitus  Physical Exam: Filed Vitals:   05/23/16 1830 05/23/16 1900 05/23/16 1930 05/23/16 2000  BP: 106/77 103/82 109/82 94/71  Pulse:    61  Temp:      TempSrc:      Height:      Weight:      SpO2:    100%    General:  Arousable but lethargic, well developed and nourished, weak Eyes: PERRLA, pink conjunctiva, no scleral icterus ENT: Moist oral mucosa, neck supple, no thyromegaly Lungs: clear to ascultation, no wheeze, no crackles, no use of accessory muscles Cardiovascular: regular rate and rhythm, no regurgitation, no gallops, no murmurs. No carotid bruits, no JVD Abdomen: soft, positive BS, non-tender, non-distended, no organomegaly, not an acute abdomen GU: not examined Neuro: CN II - XII unable to properly assess due to patient's lethargy Musculoskeletal: strength unable to assess due to patient's lethargy, no clubbing, cyanosis or 1+ bilateral lower extremity edema Skin: no rash, no subcutaneous crepitation, no decubitus Psych: Lethargic patient   Labs on Admission:   Recent Labs  05/23/16 1823  NA 118*  K 4.2  CL 73*  CO2 37*  GLUCOSE 124*  BUN 16  CREATININE 0.86  CALCIUM 8.5*  MG 1.6*    Recent Labs  05/23/16 1823  AST 22  ALT 16  ALKPHOS 157*  BILITOT 0.6  PROT 5.9*  ALBUMIN 2.8*   No results for input(s): LIPASE, AMYLASE in the last 72 hours.  Recent Labs  05/23/16 1823  WBC 8.0  HGB 11.1*  HCT 32.3*  MCV 86.4  PLT 274   No results for input(s): CKTOTAL, CKMB, CKMBINDEX, TROPONINI in the last 72 hours. Invalid input(s): POCBNP No results for input(s): DDIMER in the last 72 hours. No results for input(s): HGBA1C in the last 72 hours. No results for input(s): CHOL, HDL, LDLCALC, TRIG, CHOLHDL, LDLDIRECT in the last 72 hours. No results for input(s): TSH, T4TOTAL, T3FREE, THYROIDAB in the last 72 hours.  Invalid input(s): FREET3 No results for input(s): VITAMINB12, FOLATE,  FERRITIN, TIBC, IRON, RETICCTPCT in the last 72 hours.  Micro Results:  Results for Sierra Warren, Sierra Warren (MRN [id number]) as of 05/23/2016 20:50  Ref. Range 05/23/2016 18:55  Appearance Latest Ref Range: CLEAR  CLOUDY (A)  Bacteria, UA Latest Ref Range: NONE SEEN  RARE (A)  Bilirubin Urine Latest Ref Range: NEGATIVE  NEGATIVE  Color, Urine Latest Ref Range: YELLOW  YELLOW (A)  Glucose Latest Ref Range: NEGATIVE mg/dL NEGATIVE  Hgb urine dipstick Latest Ref Range: NEGATIVE  1+ (A)  Hyaline Casts, UA Unknown PRESENT  Ketones, ur Latest Ref Range: NEGATIVE mg/dL NEGATIVE  Leukocytes, UA Latest Ref Range: NEGATIVE  3+ (A)  Nitrite Latest Ref Range: NEGATIVE  POSITIVE (A)  pH Latest Ref Range: 5.0-8.0  6.0  Protein Latest Ref Range: NEGATIVE mg/dL NEGATIVE  RBC / HPF Latest Ref Range: 0-5 RBC/hpf TOO NUMEROUS TO C...  Specific Gravity, Urine Latest Ref Range: 1.005-1.030  1.008  Squamous Epithelial / LPF Latest Ref Range: NONE SEEN  NONE SEEN  WBC Clumps Unknown PRESENT  WBC, UA Latest Ref Range: 0-5 WBC/hpf TOO NUMEROUS TO C.Marland KitchenMarland Kitchen  Radiological Exams on Admission: No results found.  Assessment/Plan Present on Admission:  . Hyponatremia -Minute to MedSurg with telemetry -Normal saline at 100 mL an hour, BMP every 2 hours -TSH, urine sodium, urine and plasma osmolality ordered.  -Unclear etiology may be due to the above (poor nutritional intake. Patient does have some degree of fluid overload noted in the lower extremities.  Marland Kitchen possible UTI (lower urinary tract infection) -Await cultures and sensitivity. IV antibiotics ordered -This along with patient's hyponatremia could be contributing to patient's lethargy  Hypothyroidism -Resume home medications. TSH ordered  Diabetes mellitus type 2  -Patient states his been the breathalyzer given the fact she is poor nutritional intake. Regular diet ordered.  -Fingerstick blood sugars and sliding scale insulin ordered  HTN -Stable, home  medications resumed  Kenny Stern 05/23/2016, 8:13 PM

## 2016-05-23 NOTE — ED Notes (Signed)
Cath unclamped

## 2016-05-23 NOTE — ED Notes (Signed)
Bladder scan per MD request - noted

## 2016-05-23 NOTE — Progress Notes (Signed)
ANTIBIOTIC CONSULT NOTE - INITIAL  Pharmacy Consult for Levaquin  Indication: UTI  Allergies  Allergen Reactions  . Codeine Other (See Comments)    Upset stomach, diarrhea  . Penicillins Swelling    Patient Measurements: Height:  (160 cm) Weight: 181 lb 1.6 oz (82.146 kg) IBW/kg (Calculated) : 52.4 Adjusted Body Weight:   Vital Signs: Temp: 97.5 F (36.4 C) (07/07 2138) Temp Source: Oral (07/07 2138) BP: 102/75 mmHg (07/07 2138) Pulse Rate: 84 (07/07 2139) Intake/Output from previous day:   Intake/Output from this shift:    Labs:  Recent Labs  05/23/16 1823  WBC 8.0  HGB 11.1*  PLT 274  CREATININE 0.86   Estimated Creatinine Clearance: 42.4 mL/min (by C-G formula based on Cr of 0.86). No results for input(s): VANCOTROUGH, VANCOPEAK, VANCORANDOM, GENTTROUGH, GENTPEAK, GENTRANDOM, TOBRATROUGH, TOBRAPEAK, TOBRARND, AMIKACINPEAK, AMIKACINTROU, AMIKACIN in the last 72 hours.   Microbiology: No results found for this or any previous visit (from the past 720 hour(s)).  Medical History: Past Medical History  Diagnosis Date  . Diabetes mellitus without complication (HCC)   . Thyroid disease   . Hypertension     Medications:  Prescriptions prior to admission  Medication Sig Dispense Refill Last Dose  . ALPRAZolam (XANAX) 0.5 MG tablet Take 1 tablet (0.5 mg total) by mouth 2 (two) times daily as needed. 20 tablet 0 unknown at unknown  . aspirin EC 81 MG tablet Take 1 tablet by mouth every other day.   unknown at unknown  . docusate sodium (COLACE) 100 MG capsule Take 1 capsule (100 mg total) by mouth 2 (two) times daily. (Patient taking differently: Take 100 mg by mouth daily. ) 10 capsule 0 unknown at unknown  . ferrous sulfate (FERROUSUL) 325 (65 FE) MG tablet Take 1 tablet (325 mg total) by mouth 2 (two) times daily with a meal. 60 tablet 3 unknown at unknown  . furosemide (LASIX) 20 MG tablet Take 20 mg by mouth daily.   unknown at unknown  . glimepiride  (AMARYL) 2 MG tablet Take 2 tablets by mouth daily.   unknown at unknown  . insulin NPH Human (HUMULIN N,NOVOLIN N) 100 UNIT/ML injection Inject 3-8 Units into the skin 2 (two) times daily. 8 units @@ 0800, 3 units at 1800   unknown at unknown  . ipratropium-albuterol (DUONEB) 0.5-2.5 (3) MG/3ML SOLN Take 3 mLs by nebulization every 4 (four) hours as needed (cough).   unknown at unknown  . levothyroxine (SYNTHROID, LEVOTHROID) 100 MCG tablet Take 1 tablet by mouth daily.   unknown at unknown  . LYRICA 50 MG capsule Take 1 capsule by mouth daily.    unknown at unknown  . Melatonin 3 MG TABS Take 1 tablet by mouth at bedtime.   unknown at unknown  . metFORMIN (GLUCOPHAGE) 500 MG tablet Take 1 tablet by mouth daily.   unknown at unknown  . omeprazole (PRILOSEC) 20 MG capsule Take 20 mg by mouth daily.   unknown at unknown  . polyethylene glycol (MIRALAX / GLYCOLAX) packet Take 17 g by mouth 2 (two) times daily.   unknown at unknown  . senna (SENOKOT) 8.6 MG TABS tablet Take 2 tablets (17.2 mg total) by mouth daily. 60 each 0 unknown at unknown  . traZODone (DESYREL) 50 MG tablet Take 150 mg by mouth at bedtime.   unknown at unknown  . venlafaxine XR (EFFEXOR-XR) 75 MG 24 hr capsule Take 225 mg by mouth daily.   unknown at unknown  . enoxaparin (  LOVENOX) 30 MG/0.3ML injection Inject 0.3 mLs (30 mg total) into the skin daily. X 12 more days (Patient not taking: Reported on 05/23/2016) 3 mL 0 Not Taking at Unknown time  . oxyCODONE (OXY IR/ROXICODONE) 5 MG immediate release tablet Take 1-2 tablets (5-10 mg total) by mouth every 4 (four) hours as needed for moderate pain, severe pain or breakthrough pain. 30 tablet 0 unknown at unknown  . zolpidem (AMBIEN) 5 MG tablet Take 1 tablet (5 mg total) by mouth at bedtime. (Patient not taking: Reported on 05/23/2016) 30 tablet 0 Not Taking at Unknown time   Assessment: CrCl = 42.4 ml/min  Goal of Therapy:  resolution of infection  Plan:  Expected duration 7 days  with resolution of temperature and/or normalization of WBC   Levaquin 500 mg IV X 1 given on 7/7 .   Will start levaquin 250 mg IV Q24H on 7/8 @ 22:00.   Kayleb Warshaw D 05/23/2016,9:49 PM

## 2016-05-23 NOTE — Progress Notes (Signed)
Anticoagulation monitoring(Lovenox):  82 yo  ordered Lovenox 30 mg Q24h  Filed Weights   05/23/16 1829  Weight: 160 lb (72.576 kg)   BMI  Lab Results  Component Value Date   CREATININE 0.86 05/23/2016   CREATININE 1.61* 04/07/2016   CREATININE 1.71* 04/06/2016   Estimated Creatinine Clearance: 38.9 mL/min (by C-G formula based on Cr of 0.86). Hemoglobin & Hematocrit     Component Value Date/Time   HGB 11.1* 05/23/2016 1823   HGB 10.3* 11/24/2011 0416   HCT 32.3* 05/23/2016 1823   HCT 30.9* 11/24/2011 0416     Per Protocol for Patient with estCrcl< 30 ml/min and BMI < 40, will transition to Lovenox 40 mg Q24h.

## 2016-05-23 NOTE — ED Provider Notes (Signed)
[facility] Emergency Department Provider Note  Time seen: 6:30 PM  I have reviewed the triage vital signs and the nursing notes.   HISTORY  Chief Complaint Labs Only    HPI Sierra Warren is a 82 y.o. female with a past medical history of diabetes, hypothyroidism, hypertension, presents the emergency department for abnormal labs.According to EMS report the patient stays at [facility] nursing facility. States the patient has been feeling weak, they check labs which are abnormal, per EMS report the sodium was very low so they sent her to the emergency department for evaluation. Patient's only complaint is generalized weakness, denies any pain. Denies chest pain or abdominal pain. States "I just don't feel well."    Past Medical History  Diagnosis Date  . Diabetes mellitus without complication (HCC)   . Thyroid disease   . Hypertension     Patient Active Problem List   Diagnosis Date Noted  . Hip fracture (HCC) 04/03/2016    Past Surgical History  Procedure Laterality Date  . Cholecystectomy    . Abdominal hysterectomy    . Small intestine surgery    . Eye surgery Bilateral     cataracts  . Intramedullary (im) nail intertrochanteric Right 04/04/2016    Procedure: INTRAMEDULLARY (IM) NAIL INTERTROCHANTRIC;  Surgeon: Christena Flake, MD;  Location: [facility] ORS;  Service: Orthopedics;  Laterality: Right;    Current Outpatient Rx  Name  Route  Sig  Dispense  Refill  . ALPRAZolam (XANAX) 0.5 MG tablet   Oral   Take 1 tablet (0.5 mg total) by mouth 2 (two) times daily as needed.   20 tablet   0   . aspirin EC 81 MG tablet   Oral   Take 1 tablet by mouth every other day.         . docusate sodium (COLACE) 100 MG capsule   Oral   Take 1 capsule (100 mg total) by mouth 2 (two) times daily.   10 capsule   0   . enoxaparin (LOVENOX) 30 MG/0.3ML injection   Subcutaneous   Inject 0.3 mLs (30 mg total) into the skin daily. X 12 more days   3 mL  0   . ferrous sulfate (FERROUSUL) 325 (65 FE) MG tablet   Oral   Take 1 tablet (325 mg total) by mouth 2 (two) times daily with a meal.   60 tablet   3   . glimepiride (AMARYL) 2 MG tablet   Oral   Take 2 tablets by mouth daily.         Marland Kitchen levothyroxine (SYNTHROID, LEVOTHROID) 100 MCG tablet   Oral   Take 1 tablet by mouth daily.         Marland Kitchen LYRICA 50 MG capsule   Oral   Take 1 capsule by mouth 2 (two) times daily.           Dispense as written.   . metFORMIN (GLUCOPHAGE) 500 MG tablet   Oral   Take 1 tablet by mouth daily.         Marland Kitchen oxyCODONE (OXY IR/ROXICODONE) 5 MG immediate release tablet   Oral   Take 1-2 tablets (5-10 mg total) by mouth every 4 (four) hours as needed for moderate pain, severe pain or breakthrough pain.   30 tablet   0   . senna (SENOKOT) 8.6 MG TABS tablet   Oral   Take 2 tablets (17.2 mg total) by mouth daily.   60 each  0   . zolpidem (AMBIEN) 5 MG tablet   Oral   Take 1 tablet (5 mg total) by mouth at bedtime.   30 tablet   0     Allergies Codeine and Penicillins  No family history on file.  Social History Social History  Substance Use Topics  . Smoking status: Never Smoker   . Smokeless tobacco: None     Comment: no smokers in her home  . Alcohol Use: No    Review of Systems Constitutional: Negative for fever. Cardiovascular: Negative for chest pain. Respiratory: Negative for shortness of breath. Gastrointestinal: Negative for abdominal pain 10-point ROS otherwise negative.  ____________________________________________   PHYSICAL EXAM:  VITAL SIGNS: ED Triage Vitals  Enc Vitals Group     BP 05/23/16 1829 111/86 mmHg     Pulse Rate 05/23/16 1829 107     Resp --      Temp 05/23/16 1829 97.6 F (36.4 C)     Temp Source 05/23/16 1829 Oral     SpO2 05/23/16 1829 100 %     Weight 05/23/16 1829 160 lb (72.576 kg)     Height 05/23/16 1829  (1.575 m)     Head Cir --      [facility] --      Pain Score  05/23/16 1830 0     Pain Loc --      Pain Edu? --      Excl. in GC? --     Constitutional: Alert.  Well appearing and in no distress. Eyes: Normal exam ENT   Head: Normocephalic and atraumatic.   Mouth/Throat: Mucous membranes are moist. Cardiovascular: Normal rate, regular rhythm. No murmur Respiratory: Normal respiratory effort without tachypnea nor retractions. Breath sounds are clear  Gastrointestinal:  Suprapubic fullness. Otherwise soft abdomen, nontender to palpation. No rebound or guarding. Musculoskeletal: Nontender with normal range of motion in all extremities.  Neurologic:  Normal speech and language. No gross focal neurologic deficits  Skin:  Skin is warm, dry and intact.  Psychiatric: Mood and affect are normal. ____________________________________________    INITIAL IMPRESSION / ASSESSMENT AND PLAN / ED COURSE  Pertinent labs & imaging results that were available during my care of the patient were reviewed by me and considered in my medical decision making (see chart for details).  Patient presents the emergency department for generalized weakness and abnormal lab values taken at a nursing facility. Repeat labs confirm hyponatremia of 118. Chloride of 73. Bicarbonate 37. Given her abdominal fullness and bladder scan was performed showing greater than 1000 ML. Urine catheter placed with 1500 cc of drainage. Patient on IV fluids for hyponatremia. Patient will be admitted to the hospital for further workup.  ____________________________________________   FINAL CLINICAL IMPRESSION(S) / ED DIAGNOSES  hyponatremia Urinary retention   Minna Antis, MD 05/23/16 [phone]

## 2016-05-23 NOTE — ED Notes (Signed)
Per EMS [facility] said that her labs were abnormal and the MD there would not give them an order for IV fluids to replenish so sent to ED for eval for IV fluids 

## 2016-05-24 LAB — BASIC METABOLIC PANEL
ANION GAP: 5 (ref 5–15)
ANION GAP: 4 — AB (ref 5–15)
Anion gap: 6 (ref 5–15)
Anion gap: 6 (ref 5–15)
BUN: 15 mg/dL (ref 6–20)
BUN: 15 mg/dL (ref 6–20)
BUN: 15 mg/dL (ref 6–20)
BUN: 15 mg/dL (ref 6–20)
CALCIUM: 8 mg/dL — AB (ref 8.9–10.3)
CALCIUM: 8 mg/dL — AB (ref 8.9–10.3)
CHLORIDE: 81 mmol/L — AB (ref 101–111)
CHLORIDE: 82 mmol/L — AB (ref 101–111)
CO2: 32 mmol/L (ref 22–32)
CO2: 34 mmol/L — AB (ref 22–32)
CO2: 35 mmol/L — AB (ref 22–32)
CO2: 35 mmol/L — AB (ref 22–32)
CREATININE: 0.67 mg/dL (ref 0.44–1.00)
CREATININE: 0.71 mg/dL (ref 0.44–1.00)
Calcium: 7.8 mg/dL — ABNORMAL LOW (ref 8.9–10.3)
Calcium: 8 mg/dL — ABNORMAL LOW (ref 8.9–10.3)
Chloride: 82 mmol/L — ABNORMAL LOW (ref 101–111)
Chloride: 83 mmol/L — ABNORMAL LOW (ref 101–111)
Creatinine, Ser: 0.76 mg/dL (ref 0.44–1.00)
Creatinine, Ser: 0.74 mg/dL (ref 0.44–1.00)
GFR calc Af Amer: >60 mL/min (ref >60)
GFR calc non Af Amer: >60 mL/min (ref >60)
GFR calc non Af Amer: >60 mL/min (ref >60)
GFR calc non Af Amer: >60 mL/min (ref >60)
GLUCOSE: 50 mg/dL — AB (ref 65–99)
GLUCOSE: 37 mg/dL — AB (ref 65–99)
Glucose, Bld: 67 mg/dL (ref 65–99)
Glucose, Bld: 94 mg/dL (ref 65–99)
POTASSIUM: 4.1 mmol/L (ref 3.5–5.1)
POTASSIUM: 4.1 mmol/L (ref 3.5–5.1)
POTASSIUM: 4.4 mmol/L (ref 3.5–5.1)
Potassium: 4 mmol/L (ref 3.5–5.1)
Sodium: 120 mmol/L — ABNORMAL LOW (ref 135–145)
Sodium: 121 mmol/L — ABNORMAL LOW (ref 135–145)
Sodium: 122 mmol/L — ABNORMAL LOW (ref 135–145)
Sodium: 122 mmol/L — ABNORMAL LOW (ref 135–145)

## 2016-05-24 LAB — CBC
HEMATOCRIT: 29.8 % — AB (ref 35.0–47.0)
HEMOGLOBIN: 10.2 g/dL — AB (ref 12.0–16.0)
MCH: 29.8 pg (ref 26.0–34.0)
MCHC: 34.2 g/dL (ref 32.0–36.0)
MCV: 87.3 fL (ref 80.0–100.0)
Platelets: 258 10*3/uL (ref 150–440)
RBC: 3.42 MIL/uL — AB (ref 3.80–5.20)
RDW: 15.4 % — ABNORMAL HIGH (ref 11.5–14.5)
WBC: 8.7 10*3/uL (ref 3.6–11.0)

## 2016-05-24 LAB — GLUCOSE, CAPILLARY
GLUCOSE-CAPILLARY: 37 mg/dL — AB (ref 65–99)
GLUCOSE-CAPILLARY: 57 mg/dL — AB (ref 65–99)
GLUCOSE-CAPILLARY: 61 mg/dL — AB (ref 65–99)
GLUCOSE-CAPILLARY: 119 mg/dL — AB (ref 65–99)
Glucose-Capillary: 96 mg/dL (ref 65–99)
Glucose-Capillary: 188 mg/dL — ABNORMAL HIGH (ref 65–99)
Glucose-Capillary: 230 mg/dL — ABNORMAL HIGH (ref 65–99)

## 2016-05-24 MED ORDER — DEXTROSE-NACL 5-0.9 % IV SOLN
INTRAVENOUS | Status: DC
  Administered 2016-05-24: 08:00:00 via INTRAVENOUS

## 2016-05-24 MED ORDER — DEXTROSE 50 % IV SOLN
INTRAVENOUS | Status: AC
  Administered 2016-05-24: 06:00:00 25 mL
  Filled 2016-05-24: qty 50

## 2016-05-24 MED ORDER — DEXTROSE 50 % IV SOLN: 25.0000 mL | Freq: Once | INTRAVENOUS | Status: DC

## 2016-05-24 NOTE — Progress Notes (Signed)
Spoke with Dr Cherylann Ratel in the hallway regarding [facility] order for foley catheter due to urinary retention; verbalized at this point leave the catheter in for the next 24 hours due to pt's history before admission and for strict I&O

## 2016-05-24 NOTE — Progress Notes (Signed)
[facility] [place] [facility]   PATIENT NAME: Sierra Warren    MR#:  [id number]  DATE OF BIRTH:  1920-12-26  SUBJECTIVE:  CHIEF COMPLAINT:   Chief Complaint  Patient presents with  . Labs Only   Flat affect. Poor historian Feels weak No other concerns  Family at bedside mentions patient is more awake. Afebrile  REVIEW OF SYSTEMS:    Review of Systems  Unable to perform ROS: dementia    DRUG ALLERGIES:   Allergies  Allergen Reactions  . Codeine Other (See Comments)    Upset stomach, diarrhea  . Penicillins Swelling    VITALS:  Blood pressure 153/75, pulse 112, temperature 98.3 F (36.8 C), temperature source Oral, resp. rate 17, height 5\' 3"  (1.6 m), weight 82.146 kg (181 lb 1.6 oz), SpO2 100 %.  PHYSICAL EXAMINATION:   Physical Exam  GENERAL:  82 y.o.-year-old patient lying in the bed with no acute distress.  EYES: Pupils equal, round, reactive to light and accommodation. No scleral icterus. Extraocular muscles intact.  HEENT: Head atraumatic, normocephalic. Oropharynx and nasopharynx clear.  NECK:  Supple, no jugular venous distention. No thyroid enlargement, no tenderness.  LUNGS: Normal breath sounds bilaterally, no wheezing, rales, rhonchi. No use of accessory muscles of respiration.  CARDIOVASCULAR: S1, S2 normal. No murmurs, rubs, or gallops. ABDOMEN: Soft, nontender, nondistended. Bowel sounds present. No organomegaly or mass.  EXTREMITIES: No cyanosis, clubbing. Lower extremity edema. NEUROLOGIC: Cranial nerves II through XII are intact. No focal Motor or sensory deficits b/l. PSYCHIATRIC: The patient is alert and awake.  LABORATORY PANEL:   CBC  Recent Labs Lab 05/24/16 0545  WBC 8.7  HGB 10.2*  HCT 29.8*  PLT 258   ------------------------------------------------------------------------------------------------------------------ Chemistries   Recent Labs Lab 05/23/16 1823  05/24/16 0545  NA 118*  < > 122*   K 4.2  < > 4.1  CL 73*  < > 83*  CO2 37*  < > 35*  GLUCOSE 124*  < > 37*  BUN 16  < > 15  CREATININE 0.86  < > 0.74  CALCIUM 8.5*  < > 8.0*  MG 1.6*  --   --   AST 22  --   --   ALT 16  --   --   ALKPHOS 157*  --   --   BILITOT 0.6  --   --   < > = values in this interval not displayed. ------------------------------------------------------------------------------------------------------------------  Cardiac Enzymes No results for input(s): TROPONINI in the last 168 hours. ------------------------------------------------------------------------------------------------------------------  RADIOLOGY:  No results found.   ASSESSMENT AND PLAN:   . Hyponatremia, Hypovolemic - Continue normal saline. - Due to poor oral intake with food and fluids. - We'll consult nephrology for further input.  . possible UTI (lower urinary tract infection) -Await cultures and sensitivity. IV antibiotics ordered  Hypothyroidism -Resume home medications.  Diabetes mellitus type 2 with hypoglycemia Stat dose of D50 given. Hold oral hypoglycemics. Will start D5 normal saline for hypoglycemia.  HTN -Stable, home medications resumed  All the records are reviewed and case discussed with Care Management/Social Workerr. Management plans discussed with the patient, family and they are in agreement.  CODE STATUS: DNR  DVT Prophylaxis: SCDs  TOTAL CRITICAL CARE TIME TAKING CARE OF THIS PATIENT: 35 minutes.   POSSIBLE D/C IN 2-3 DAYS, DEPENDING ON CLINICAL CONDITION.  Milagros Loll R M.D on 05/24/2016 at 1:02 PM  Between 7am to 6pm - Pager - [phone]  After 6pm go  to [email or web address] - [email or web address]  [facility]  Office  [phone]  CC: Primary care physician; Danella Penton, MD  Note: This dictation was prepared with Dragon dictation along with smaller phrase technology. Any transcriptional errors that result from this process are unintentional.

## 2016-05-24 NOTE — Progress Notes (Signed)
[employer] brought in by the family for the pt

## 2016-05-24 NOTE — Consult Note (Signed)
[facility] CONSULT NOTE    Date: 05/24/2016                  Patient Name:  Sierra Warren  MRN: [id number]  DOB: 01-Nov-1921  Age / Sex: 82 y.o., female         PCP: Danella Penton, MD                 Service Requesting Consult: Dr. Elpidio Anis                 Reason for Consult: hyponatremia            History of Present Illness: Patient is a 82 y.o. female with a PMHx of Diabetes mellitus type 2, hypertension, status post cholecystectomy, right hip fracture status post intramedullary nail in the right hip, who was admitted to [facility] on 05/23/2016 for evaluation of hyponatremia. Patient is extremely hard of hearing and unable to answer questions. Most of the history is obtained through chart review and in discussion with the patient's daughter. She has been residing in a nursing home since she underwent repair of a right femoral fracture. However since that point in time she's had decline in her health. We are asked to see her for hyponatremia. Serum sodium upon presentation was 118. Currently sodium is up to 122. In review of studies serum osmolality was 252. Urine osmolality was very low at 180 in the urine sodium was less than 10 suggesting that there is an element of intravascular volume depletion. The patient's daughter states that she is eating very little. She attempts to drink liquids which may be causing further reduction in serum sodium without solute intake.   Medications: Outpatient medications: Prescriptions prior to admission  Medication Sig Dispense Refill Last Dose  . ALPRAZolam (XANAX) 0.5 MG tablet Take 1 tablet (0.5 mg total) by mouth 2 (two) times daily as needed. 20 tablet 0 unknown at unknown  . aspirin EC 81 MG tablet Take 1 tablet by mouth every other day.   unknown at unknown  . docusate sodium (COLACE) 100 MG capsule Take 1 capsule (100 mg total) by mouth 2 (two) times daily. (Patient taking differently: Take 100 mg by mouth daily. ) 10 capsule 0  unknown at unknown  . ferrous sulfate (FERROUSUL) 325 (65 FE) MG tablet Take 1 tablet (325 mg total) by mouth 2 (two) times daily with a meal. 60 tablet 3 unknown at unknown  . furosemide (LASIX) 20 MG tablet Take 20 mg by mouth daily.   unknown at unknown  . glimepiride (AMARYL) 2 MG tablet Take 2 tablets by mouth daily.   unknown at unknown  . insulin NPH Human (HUMULIN N,NOVOLIN N) 100 UNIT/ML injection Inject 3-8 Units into the skin 2 (two) times daily. 8 units @@ 0800, 3 units at 1800   unknown at unknown  . ipratropium-albuterol (DUONEB) 0.5-2.5 (3) MG/3ML SOLN Take 3 mLs by nebulization every 4 (four) hours as needed (cough).   unknown at unknown  . levothyroxine (SYNTHROID, LEVOTHROID) 100 MCG tablet Take 1 tablet by mouth daily.   unknown at unknown  . LYRICA 50 MG capsule Take 1 capsule by mouth daily.    unknown at unknown  . Melatonin 3 MG TABS Take 1 tablet by mouth at bedtime.   unknown at unknown  . metFORMIN (GLUCOPHAGE) 500 MG tablet Take 1 tablet by mouth daily.   unknown at unknown  . omeprazole (PRILOSEC) 20 MG capsule Take 20  mg by mouth daily.   unknown at unknown  . polyethylene glycol (MIRALAX / GLYCOLAX) packet Take 17 g by mouth 2 (two) times daily.   unknown at unknown  . senna (SENOKOT) 8.6 MG TABS tablet Take 2 tablets (17.2 mg total) by mouth daily. 60 each 0 unknown at unknown  . traZODone (DESYREL) 50 MG tablet Take 150 mg by mouth at bedtime.   unknown at unknown  . venlafaxine XR (EFFEXOR-XR) 75 MG 24 hr capsule Take 225 mg by mouth daily.   unknown at unknown  . enoxaparin (LOVENOX) 30 MG/0.3ML injection Inject 0.3 mLs (30 mg total) into the skin daily. X 12 more days (Patient not taking: Reported on 05/23/2016) 3 mL 0 Not Taking at Unknown time  . oxyCODONE (OXY IR/ROXICODONE) 5 MG immediate release tablet Take 1-2 tablets (5-10 mg total) by mouth every 4 (four) hours as needed for moderate pain, severe pain or breakthrough pain. 30 tablet 0 unknown at unknown  .  zolpidem (AMBIEN) 5 MG tablet Take 1 tablet (5 mg total) by mouth at bedtime. (Patient not taking: Reported on 05/23/2016) 30 tablet 0 Not Taking at Unknown time    Current medications: Current Facility-Administered Medications  Medication Dose Route Frequency Provider Last Rate Last Dose  . acetaminophen (TYLENOL) tablet 650 mg  650 mg Oral Q6H PRN Debby Crosley, MD       Or  . acetaminophen (TYLENOL) suppository 650 mg  650 mg Rectal Q6H PRN Debby Crosley, MD      . dextrose 5 %-0.9 % sodium chloride infusion   Intravenous Continuous Milagros Loll, MD 50 mL/hr at 05/24/16 1610    . dextrose 50 % solution 25 mL  25 mL Intravenous Once Debby Crosley, MD   25 mL at 05/24/16 0631  . enoxaparin (LOVENOX) injection 40 mg  40 mg Subcutaneous Q24H Debby Crosley, MD   40 mg at 05/23/16 2233  . insulin aspart (novoLOG) injection 0-15 Units  0-15 Units Subcutaneous TID WC Debby Crosley, MD   0 Units at 05/24/16 0801  . insulin aspart (novoLOG) injection 0-5 Units  0-5 Units Subcutaneous QHS Gery Pray, MD   0 Units at 05/23/16 2156  . Levofloxacin (LEVAQUIN) IVPB 250 mg  250 mg Intravenous Q24H Debby Crosley, MD      . levothyroxine (SYNTHROID, LEVOTHROID) tablet 100 mcg  100 mcg Oral QAC breakfast Debby Crosley, MD   100 mcg at 05/23/16 2151  . ondansetron (ZOFRAN) tablet 4 mg  4 mg Oral Q6H PRN Debby Crosley, MD       Or  . ondansetron (ZOFRAN) injection 4 mg  4 mg Intravenous Q6H PRN Gery Pray, MD          Allergies: Allergies  Allergen Reactions  . Codeine Other (See Comments)    Upset stomach, diarrhea  . Penicillins Swelling      Past Medical History: Past Medical History  Diagnosis Date  . Diabetes mellitus without complication (HCC)   . Thyroid disease   . Hypertension      Past Surgical History: Past Surgical History  Procedure Laterality Date  . Cholecystectomy    . Abdominal hysterectomy    . Small intestine surgery    . Eye surgery Bilateral     cataracts  .  Intramedullary (im) nail intertrochanteric Right 04/04/2016    Procedure: INTRAMEDULLARY (IM) NAIL INTERTROCHANTRIC;  Surgeon: Christena Flake, MD;  Location: [facility] ORS;  Service: Orthopedics;  Laterality: Right;     Family History: No  family history on file.   Social History: Social History   Social History  . Marital Status: Widowed    Spouse Name: N/A  . Number of Children: N/A  . Years of Education: N/A   Occupational History  . Not on file.   Social History Main Topics  . Smoking status: Never Smoker   . Smokeless tobacco: Not on file     Comment: no smokers in her home  . Alcohol Use: No  . Drug Use: No  . Sexual Activity: Not on file   Other Topics Concern  . Not on file   Social History Narrative     Review of Systems: Pt unable to provide ROS due to being extremely hard of hearing  Vital Signs: Blood pressure 153/75, pulse 112, temperature 98.3 F (36.8 C), temperature source Oral, resp. rate 17, height  (1.6 m), weight 82.146 kg (181 lb 1.6 oz), SpO2 100 %.  Weight trends: Filed Weights   05/23/16 1829 05/23/16 2138  Weight: 72.576 kg (160 lb) 82.146 kg (181 lb 1.6 oz)    Physical Exam: General: NAD, resting in bed  Head: Normocephalic, atraumatic.  Eyes: Anicteric, EOMI  Nose: Mucous membranes moist, not inflammed, nonerythematous.  Throat: Oropharynx nonerythematous, no exudate appreciated.   Neck: Supple, trachea midline.  Lungs:  Normal respiratory effort. Clear to auscultation BL without crackles or wheezes.  Heart: RRR. S1 and S2 normal without gallop, murmur, or rubs.  Abdomen:  BS normoactive. Soft, Nondistended, non-tender.  No masses or organomegaly.  Extremities: 2+ pretibial edema.  Neurologic: Awake, alert, will occasionally follow simple commands  Skin: No visible rashes, scars.    Lab results: Basic Metabolic Panel:  Recent Labs Lab 05/23/16 1823  05/24/16 0206 05/24/16 0342 05/24/16 0545  NA 118*  < > 120* 122* 122*   K 4.2  < > 4.0 4.4 4.1  CL 73*  < > 82* 82* 83*  CO2 37*  < > 32 35* 35*  GLUCOSE 124*  < > 67 50* 37*  BUN 16  < > CREATININE 0.86  < > 0.67 0.76 0.74  CALCIUM 8.5*  < > 7.8* 8.0* 8.0*  MG 1.6*  --   --   --   --   < > = values in this interval not displayed.  Liver Function Tests:  Recent Labs Lab 05/23/16 1823  AST 22  ALT 16  ALKPHOS 157*  BILITOT 0.6  PROT 5.9*  ALBUMIN 2.8*   No results for input(s): LIPASE, AMYLASE in the last 168 hours. No results for input(s): AMMONIA in the last 168 hours.  CBC:  Recent Labs Lab 05/23/16 1823 05/24/16 0545  WBC 8.0 8.7  HGB 11.1* 10.2*  HCT 32.3* 29.8*  MCV 86.4 87.3  PLT 274 258    Cardiac Enzymes: No results for input(s): CKTOTAL, CKMB, CKMBINDEX, TROPONINI in the last 168 hours.  BNP: Invalid input(s): POCBNP  CBG:  Recent Labs Lab 05/24/16 0623 05/24/16 0644 05/24/16 0755 05/24/16 0819 05/24/16 1142  GLUCAP 37* 96 61* 57* 119*    Microbiology: Results for orders placed or performed during the hospital encounter of 05/23/16  MRSA PCR Screening     Status: None   Collection Time: 05/23/16 10:14 PM  Result Value Ref Range Status   MRSA by PCR NEGATIVE NEGATIVE Final    Comment:        The GeneXpert MRSA Assay (FDA approved for NASAL specimens only), is one component of  a comprehensive MRSA colonization surveillance program. It is not intended to diagnose MRSA infection nor to guide or monitor treatment for MRSA infections.     Coagulation Studies: No results for input(s): LABPROT, INR in the last 72 hours.  Urinalysis:  Recent Labs  05/23/16 1855  COLORURINE YELLOW*  LABSPEC 1.008  PHURINE 6.0  GLUCOSEU NEGATIVE  HGBUR 1+*  BILIRUBINUR NEGATIVE  KETONESUR NEGATIVE  PROTEINUR NEGATIVE  NITRITE POSITIVE*  LEUKOCYTESUR 3+*      Imaging:  No results found.   Assessment & Plan: Pt is a 82 y.o. female  with a PMHx of Depression, BPH, hyperlipidemia, GERD, vitamin D  deficiency, constipation, hypertension, multiple sclerosis, coronary artery disease, chronic pain syndrome, who was admitted to [facility] on 05/23/2016 for evaluation of acute renal failure.   1.  Hyponatremia, low serum and urine osm, with poor solute intake 2.  Urinary retention 3.  Lower extremity edema  Plan:  The patient presents with an interesting case. She has low serum and urine osmolality. Urine sodium was also low. This suggests low circulating volume. Therefore we would recommend continued IV fluid hydration with D5 normal saline. Continue to monitor serum sodium regularly. In addition she is found to have pyuria and could potentially have an underlying urinary tract infection.  Therefore would recommend obtaining urine culture. She also has urinary retention and we recommend keeping the Foley catheter in place. Urinary retention may be secondary to hyponatremia. In addition we recommend having goals of care discussion with the patient and her family as she is not having much by mouth intake. I discussed the potential for PEG tube placement with the family however it is unclear as to whether they would be interested in this. Further plan based upon how patient responds to therapy.

## 2016-05-24 NOTE — Plan of Care (Signed)
Problem: Health Behavior/Discharge Planning: Goal: Ability to manage health-related needs will improve Outcome: Not Progressing Verbally encouraging pt to eat and drink

## 2016-05-25 ENCOUNTER — Inpatient Hospital Stay: Payer: PPO

## 2016-05-25 LAB — GLUCOSE, CAPILLARY
GLUCOSE-CAPILLARY: 184 mg/dL — AB (ref 65–99)
GLUCOSE-CAPILLARY: 102 mg/dL — AB (ref 65–99)
Glucose-Capillary: 160 mg/dL — ABNORMAL HIGH (ref 65–99)
Glucose-Capillary: 220 mg/dL — ABNORMAL HIGH (ref 65–99)

## 2016-05-25 LAB — URINE CULTURE: Special Requests: NORMAL

## 2016-05-25 LAB — THYROID PANEL WITH TSH
Free Thyroxine Index: 1.6 (ref 1.2–4.9)
T3 UPTAKE RATIO: 29 % (ref 24–39)
T4 TOTAL: 5.4 ug/dL (ref 4.5–12.0)
TSH: 11.08 u[IU]/mL — AB (ref 0.450–4.500)

## 2016-05-25 LAB — BASIC METABOLIC PANEL
ANION GAP: 6 (ref 5–15)
BUN: 13 mg/dL (ref 6–20)
CALCIUM: 7.7 mg/dL — AB (ref 8.9–10.3)
CO2: 32 mmol/L (ref 22–32)
CREATININE: 0.75 mg/dL (ref 0.44–1.00)
Chloride: 82 mmol/L — ABNORMAL LOW (ref 101–111)
GFR calc Af Amer: >60 mL/min (ref >60)
GLUCOSE: 153 mg/dL — AB (ref 65–99)
Potassium: 4.3 mmol/L (ref 3.5–5.1)
Sodium: 120 mmol/L — ABNORMAL LOW (ref 135–145)

## 2016-05-25 LAB — CBC WITH DIFFERENTIAL/PLATELET
BASOS ABS: 0.1 10*3/uL (ref 0–0.1)
BASOS PCT: 1 %
EOS PCT: 2 %
Eosinophils Absolute: 0.2 10*3/uL (ref 0–0.7)
HCT: 30.1 % — ABNORMAL LOW (ref 35.0–47.0)
Hemoglobin: 10.5 g/dL — ABNORMAL LOW (ref 12.0–16.0)
LYMPHS PCT: 18 %
Lymphs Abs: 1.6 10*3/uL (ref 1.0–3.6)
MCH: 30.2 pg (ref 26.0–34.0)
MCHC: 34.7 g/dL (ref 32.0–36.0)
MCV: 87 fL (ref 80.0–100.0)
MONO ABS: 1.2 10*3/uL — AB (ref 0.2–0.9)
Monocytes Relative: 13 %
Neutro Abs: 6 10*3/uL (ref 1.4–6.5)
Neutrophils Relative %: 66 %
PLATELETS: 256 10*3/uL (ref 150–440)
RBC: 3.46 MIL/uL — ABNORMAL LOW (ref 3.80–5.20)
RDW: 15.4 % — AB (ref 11.5–14.5)
WBC: 9 10*3/uL (ref 3.6–11.0)

## 2016-05-25 LAB — OSMOLALITY, URINE: Osmolality, Ur: 378 mOsm/kg (ref 300–900)

## 2016-05-25 LAB — SODIUM, URINE, RANDOM

## 2016-05-25 LAB — SODIUM
SODIUM: 119 mmol/L — AB (ref 135–145)
Sodium: 119 mmol/L — CL (ref 135–145)

## 2016-05-25 LAB — OSMOLALITY: Osmolality: 256 mOsm/kg — ABNORMAL LOW (ref 275–295)

## 2016-05-25 LAB — URIC ACID: URIC ACID, SERUM: 6 mg/dL (ref 2.3–6.6)

## 2016-05-25 MED ORDER — POLYETHYLENE GLYCOL 3350 17 G PO PACK
17.0000 g | PACK | Freq: Every day | ORAL | Status: DC | PRN
  Administered 2016-05-25: 17 g via ORAL
  Filled 2016-05-25: qty 1

## 2016-05-25 MED ORDER — DEXTROSE 5 % IV SOLN
1.0000 g | INTRAVENOUS | Status: DC
  Administered 2016-05-25: 08:00:00 1 g via INTRAVENOUS
  Filled 2016-05-25 (×2): qty 10

## 2016-05-25 MED ORDER — SODIUM CHLORIDE 0.9 % IV SOLN
INTRAVENOUS | Status: AC
  Administered 2016-05-25: 08:00:00 via INTRAVENOUS

## 2016-05-25 MED ORDER — GLUCERNA SHAKE PO LIQD
237.0000 mL | Freq: Three times a day (TID) | ORAL | Status: DC
  Administered 2016-05-26 – 2016-05-27 (×3): 237 mL via ORAL

## 2016-05-25 MED ORDER — SENNOSIDES-DOCUSATE SODIUM 8.6-50 MG PO TABS
1.0000 | ORAL_TABLET | Freq: Two times a day (BID) | ORAL | Status: DC
  Administered 2016-05-25 – 2016-05-26 (×4): 1 via ORAL
  Filled 2016-05-25 (×5): qty 1

## 2016-05-25 NOTE — Progress Notes (Signed)
Called by nursing staff about Na level of 129,  even lower than on admission, 120,  despite IV fluid( normal saline solution ) at 50cc/hour, discussed with Dr Cherylann Ratel, he recommended to continue IVf for now, following Na levels closely , he plans to discuss with primary team initiation of Tolvaptan vs NaCl 3% solution tomorrow morning if Na continues to decline, suspected SIADH.

## 2016-05-25 NOTE — Plan of Care (Signed)
Problem: Education: Goal: Knowledge of [facility] Education information/materials will improve Outcome: Not Progressing Pt with continued poor appetite/PO intake; pt started on PO Nutritional supplements, 0 intake to date; complains of constipation, BS x 4 + flatus, scheduled and PRN laxatives given as ordered PO; Palliative Care consult ordered for pt;

## 2016-05-25 NOTE — Progress Notes (Signed)
[facility] [place] at [facility]   PATIENT NAME: Sierra Warren    MR#:  [id number]  DATE OF BIRTH:  27-Dec-1920  SUBJECTIVE:  CHIEF COMPLAINT:   Chief Complaint  Patient presents with  . Labs Only   Flat affect. Poor historian Feels weak Afebrile.  Constipated  REVIEW OF SYSTEMS:    Review of Systems  Constitutional: Positive for malaise/fatigue. Negative for fever and chills.  HENT: Negative for sore throat.   Eyes: Negative for blurred vision, double vision and pain.  Respiratory: Positive for cough. Negative for hemoptysis, shortness of breath and wheezing.   Cardiovascular: Negative for chest pain, palpitations, orthopnea and leg swelling.  Gastrointestinal: Positive for constipation. Negative for heartburn, nausea, vomiting, abdominal pain and diarrhea.  Genitourinary: Negative for dysuria and hematuria.  Musculoskeletal: Negative for back pain and joint pain.  Skin: Negative for rash.  Neurological: Positive for weakness. Negative for sensory change, speech change, focal weakness and headaches.  Endo/Heme/Allergies: Does not bruise/bleed easily.  Psychiatric/Behavioral: Positive for depression. The patient is not nervous/anxious.     DRUG ALLERGIES:   Allergies  Allergen Reactions  . Codeine Other (See Comments)    Upset stomach, diarrhea  . Penicillins Swelling    VITALS:  Blood pressure 112/65, pulse 73, temperature 98 F (36.7 C), temperature source Oral, resp. rate 18, height  (1.6 m), weight 82.146 kg (181 lb 1.6 oz), SpO2 94 %.  PHYSICAL EXAMINATION:   Physical Exam  GENERAL:  82 y.o.-year-old patient lying in the bed with no acute distress.  EYES: Pupils equal, round, reactive to light and accommodation. No scleral icterus. Extraocular muscles intact.  HEENT: Head atraumatic, normocephalic. Oropharynx and nasopharynx clear.  NECK:  Supple, no jugular venous distention. No thyroid enlargement, no tenderness.  LUNGS:  Normal breath sounds bilaterally, no wheezing, rales, rhonchi. No use of accessory muscles of respiration.  CARDIOVASCULAR: S1, S2 normal. No murmurs, rubs, or gallops. ABDOMEN: Soft, nontender, nondistended. Bowel sounds present. No organomegaly or mass.  EXTREMITIES: No cyanosis, clubbing. Lower extremity edema. NEUROLOGIC: Cranial nerves II through XII are intact.  Motor strength 3/5 lower extremities. 5-/5 upper extremities PSYCHIATRIC: The patient is alert and awake.Flat affect  LABORATORY PANEL:   CBC  Recent Labs Lab 05/25/16 0443  WBC 9.0  HGB 10.5*  HCT 30.1*  PLT 256   ------------------------------------------------------------------------------------------------------------------ Chemistries   Recent Labs Lab 05/23/16 1823  05/25/16 0443  NA 118*  < > 120*  K 4.2  < > 4.3  CL 73*  < > 82*  CO2 37*  < > 32  GLUCOSE 124*  < > 153*  BUN 16  < > 13  CREATININE 0.86  < > 0.75  CALCIUM 8.5*  < > 7.7*  MG 1.6*  --   --   AST 22  --   --   ALT 16  --   --   ALKPHOS 157*  --   --   BILITOT 0.6  --   --   < > = values in this interval not displayed. ------------------------------------------------------------------------------------------------------------------  Cardiac Enzymes No results for input(s): TROPONINI in the last 168 hours. ------------------------------------------------------------------------------------------------------------------  RADIOLOGY:  No results found.   ASSESSMENT AND PLAN:   . Hyponatremia, Hypovolemic - Continue normal saline. - Due to poor oral intake with food and fluids. - We'll consult nephrology for further input.  . possible UTI (lower urinary tract infection) -Await cultures and sensitivity.  - On ceftriaxone  Hypothyroidism -Resume home medications.  Diabetes mellitus type 2 with hypoglycemia - Due to poor PO intake Hold oral hypoglycemics. Was on D5- NS BS better. Will change to NS and monitor  glucose.  HTN -Stable, home medications resumed  Consult palliatice care as patient has been worsening since her hip fracture. Malnutrition. Depression  All the records are reviewed and case discussed with Care Management/Social Workerr. Management plans discussed with the  family and they are in agreement.  CODE STATUS: DNR  DVT Prophylaxis: SCDs  TOTAL TIME TAKING CARE OF THIS PATIENT: 35 minutes.   POSSIBLE D/C IN 2-3 DAYS, DEPENDING ON CLINICAL CONDITION.  Milagros Loll R M.D on 05/25/2016 at 9:11 AM  Between 7am to 6pm - Pager - [phone]  After 6pm go to [email or web address] - password [email or web address]  [facility]  Office  [phone]  CC: Primary care physician; Danella Penton, MD  Note: This dictation was prepared with Dragon dictation along with smaller phrase technology. Any transcriptional errors that result from this process are unintentional.

## 2016-05-25 NOTE — Progress Notes (Signed)
Critical low sodium called 119, spoke to dr Joneen Roach, will wait to recheck in 4 hours and see if the NS is helping

## 2016-05-25 NOTE — Progress Notes (Signed)
[facility]  ROUNDING NOTE   Subjective:  Patient reports that she continues to have poor appetite. Still not eating very much. Serum sodium actually dropped a bit today down to 120. She's also had some periods of hypoglycemia.   Objective:  Vital signs in last 24 hours:  Temp:  [97.8 F (36.6 C)-98.7 F (37.1 C)] 98 F (36.7 C) (07/09 0508) Pulse Rate:  [54-106] 73 (07/09 0508) Resp:  [18-20] 18 (07/09 0508) BP: (109-122)/(65-84) 112/65 mmHg (07/09 0508) SpO2:  [94 %-100 %] 94 % (07/09 0508)  Weight change:  Filed Weights   05/23/16 1829 05/23/16 2138  Weight: 72.576 kg (160 lb) 82.146 kg (181 lb 1.6 oz)    Intake/Output: I/O last 3 completed shifts: In: 2286 [P.O.:360; I.V.:1926] Out: 500 [Urine:500]   Intake/Output this shift:  Total I/O In: 805 [I.V.:805] Out: -   Physical Exam: General: NAD, sitting in bed  Head: Normocephalic, atraumatic. Moist oral mucosal membranes  Eyes: Anicteric  Neck: Supple, trachea midline  Lungs:  Clear to auscultation, normal effort  Heart: S1S2 no rubs  Abdomen:  Soft, nontender, BS present   Extremities: 1+ peripheral edema.  Neurologic: Awake, alert, following commands  Skin: No lesions       Basic Metabolic Panel:  Recent Labs Lab 05/23/16 1823  05/23/16 2345 05/24/16 0206 05/24/16 0342 05/24/16 0545 05/25/16 0443  NA 118*  < > 121* 120* 122* 122* 120*  K 4.2  < > 4.1 4.0 4.4 4.1 4.3  CL 73*  < > 81* 82* 82* 83* 82*  CO2 37*  < > 34* 32 35* 35* 32  GLUCOSE 124*  < > 94 67 50* 37* 153*  BUN 16  < > CREATININE 0.86  < > 0.71 0.67 0.76 0.74 0.75  CALCIUM 8.5*  < > 8.0* 7.8* 8.0* 8.0* 7.7*  MG 1.6*  --   --   --   --   --   --   < > = values in this interval not displayed.  Liver Function Tests:  Recent Labs Lab 05/23/16 1823  AST 22  ALT 16  ALKPHOS 157*  BILITOT 0.6  PROT 5.9*  ALBUMIN 2.8*   No results for input(s): LIPASE, AMYLASE in the last 168 hours. No results  for input(s): AMMONIA in the last 168 hours.  CBC:  Recent Labs Lab 05/23/16 1823 05/24/16 0545 05/25/16 0443  WBC 8.0 8.7 9.0  NEUTROABS  --   --  6.0  HGB 11.1* 10.2* 10.5*  HCT 32.3* 29.8* 30.1*  MCV 86.4 87.3 87.0  PLT 274 258 256    Cardiac Enzymes: No results for input(s): CKTOTAL, CKMB, CKMBINDEX, TROPONINI in the last 168 hours.  BNP: Invalid input(s): POCBNP  CBG:  Recent Labs Lab 05/24/16 0819 05/24/16 1142 05/24/16 1641 05/24/16 2133 05/25/16 0735  GLUCAP 57* 119* 188* 230* 160*    Microbiology: Results for orders placed or performed during the hospital encounter of 05/23/16  Urine culture     Status: Abnormal   Collection Time: 05/23/16  9:49 PM  Result Value Ref Range Status   Specimen Description URINE, RANDOM  Final   Special Requests Normal  Final   Culture MULTIPLE SPECIES PRESENT, SUGGEST RECOLLECTION (A)  Final   Report Status 05/25/2016 FINAL  Final  MRSA PCR Screening     Status: None   Collection Time: 05/23/16 10:14 PM  Result Value Ref Range Status   MRSA by PCR NEGATIVE  NEGATIVE Final    Comment:        The GeneXpert MRSA Assay (FDA approved for NASAL specimens only), is one component of a comprehensive MRSA colonization surveillance program. It is not intended to diagnose MRSA infection nor to guide or monitor treatment for MRSA infections.     Coagulation Studies: No results for input(s): LABPROT, INR in the last 72 hours.  Urinalysis:  Recent Labs  05/23/16 1855  COLORURINE YELLOW*  LABSPEC 1.008  PHURINE 6.0  GLUCOSEU NEGATIVE  HGBUR 1+*  BILIRUBINUR NEGATIVE  KETONESUR NEGATIVE  PROTEINUR NEGATIVE  NITRITE POSITIVE*  LEUKOCYTESUR 3+*      Imaging: Dg Chest 2 View  05/25/2016  CLINICAL DATA:  82 year old female with generalized weakness, hypoxia. Initial encounter. EXAM: CHEST  2 VIEW COMPARISON:  04/06/2016 and earlier. FINDINGS: Seated AP and lateral views of the chest. Small to moderate bilateral  pleural effusions including fluid tracking into the major fissures. Cardiomegaly. Other mediastinal contours are within normal limits. Increased interstitial opacity favored due to congested pulmonary vasculature. Interval removal right PICC line. Osteopenia. No acute osseous abnormality identified. Stable cholecystectomy clips. IMPRESSION: Appearance most compatible with pulmonary interstitial edema and small to moderate bilateral pleural effusions. Electronically Signed   By: Odessa Fleming M.D.   On: 05/25/2016 09:20     Medications:   . sodium chloride 50 mL/hr at 05/25/16 0823   . cefTRIAXone (ROCEPHIN)  IV  1 g Intravenous Q24H  . dextrose  25 mL Intravenous Once  . enoxaparin  40 mg Subcutaneous Q24H  . feeding supplement (GLUCERNA SHAKE)  237 mL Oral TID BM  . insulin aspart  0-15 Units Subcutaneous TID WC  . insulin aspart  0-5 Units Subcutaneous QHS  . levothyroxine  100 mcg Oral QAC breakfast  . senna-docusate  1 tablet Oral BID   acetaminophen **OR** acetaminophen, ondansetron **OR** ondansetron (ZOFRAN) IV, polyethylene glycol  Assessment/ Plan:  82 y.o. female with a PMHx of Depression, BPH, hyperlipidemia, GERD, vitamin D deficiency, constipation, hypertension, multiple sclerosis, coronary artery disease, chronic pain syndrome, who was admitted to [facility] on 05/23/2016 for evaluation of acute renal failure.   1. Hyponatremia, low serum and urine osm, with poor solute intake 2. Urinary retention 3. Lower extremity edema  Plan:   Patient has an interesting case. Serum sodium is actually dropped a bit today despite normal saline. Of note as before the patient has low serum osmolality however her urine osmolality is even lower which goes against SIADH. We will repeat serum osmolality, urine osmolality, and urine sodium. For now continue 0.9 normal saline at 50 cc per hour.  In addition agree with palliative care consult given the fact that the patient has had rather poor by mouth intake  as an outpatient and doesn't seem to want aggressive care.   LOS: 2 Talal Fritchman 7/9/201711:35 AM

## 2016-05-26 LAB — BASIC METABOLIC PANEL
ANION GAP: 6 (ref 5–15)
BUN: 12 mg/dL (ref 6–20)
CALCIUM: 7.9 mg/dL — AB (ref 8.9–10.3)
CO2: 31 mmol/L (ref 22–32)
CREATININE: 0.66 mg/dL (ref 0.44–1.00)
Chloride: 83 mmol/L — ABNORMAL LOW (ref 101–111)
GFR calc Af Amer: >60 mL/min (ref >60)
GLUCOSE: 118 mg/dL — AB (ref 65–99)
Potassium: 4.2 mmol/L (ref 3.5–5.1)
Sodium: 120 mmol/L — ABNORMAL LOW (ref 135–145)

## 2016-05-26 LAB — SODIUM: SODIUM: 119 mmol/L — AB (ref 135–145)

## 2016-05-26 LAB — GLUCOSE, CAPILLARY
GLUCOSE-CAPILLARY: 162 mg/dL — AB (ref 65–99)
GLUCOSE-CAPILLARY: 167 mg/dL — AB (ref 65–99)
Glucose-Capillary: 118 mg/dL — ABNORMAL HIGH (ref 65–99)
Glucose-Capillary: 168 mg/dL — ABNORMAL HIGH (ref 65–99)

## 2016-05-26 LAB — CBC WITH DIFFERENTIAL/PLATELET
BASOS ABS: 0.1 10*3/uL (ref 0–0.1)
BASOS PCT: 1 %
EOS ABS: 0.1 10*3/uL (ref 0–0.7)
EOS PCT: 1 %
HEMATOCRIT: 32.1 % — AB (ref 35.0–47.0)
HEMOGLOBIN: 10.8 g/dL — AB (ref 12.0–16.0)
Lymphocytes Relative: 13 %
Lymphs Abs: 1.3 10*3/uL (ref 1.0–3.6)
MCH: 29.3 pg (ref 26.0–34.0)
MCHC: 33.7 g/dL (ref 32.0–36.0)
MCV: 87 fL (ref 80.0–100.0)
MONO ABS: 1.2 10*3/uL — AB (ref 0.2–0.9)
MONOS PCT: 12 %
NEUTROS ABS: 7.1 10*3/uL — AB (ref 1.4–6.5)
Neutrophils Relative %: 73 %
Platelets: 291 10*3/uL (ref 150–440)
RBC: 3.68 MIL/uL — ABNORMAL LOW (ref 3.80–5.20)
RDW: 15.2 % — ABNORMAL HIGH (ref 11.5–14.5)
WBC: 9.7 10*3/uL (ref 3.6–11.0)

## 2016-05-26 MED ORDER — HYDROCODONE-ACETAMINOPHEN 5-325 MG PO TABS
1.0000 | ORAL_TABLET | Freq: Four times a day (QID) | ORAL | Status: DC | PRN
  Administered 2016-05-26 – 2016-05-27 (×4): 1 via ORAL
  Filled 2016-05-26 (×4): qty 1

## 2016-05-26 MED ORDER — SODIUM CHLORIDE 1 G PO TABS
1.0000 g | ORAL_TABLET | Freq: Three times a day (TID) | ORAL | Status: DC
  Administered 2016-05-26 – 2016-05-27 (×2): 1 g via ORAL
  Filled 2016-05-26 (×2): qty 1

## 2016-05-26 MED ORDER — ZOLPIDEM TARTRATE 5 MG PO TABS
2.5000 mg | ORAL_TABLET | Freq: Every evening | ORAL | Status: DC | PRN
  Administered 2016-05-26: 2.5 mg via ORAL
  Filled 2016-05-26: qty 1

## 2016-05-26 NOTE — Progress Notes (Signed)
[facility]  ROUNDING NOTE   Subjective:   Not eating much. Sodium has not improved. Off IV fluids.   Objective:  Vital signs in last 24 hours:  Temp:  [98.3 F (36.8 C)-98.6 F (37 C)] 98.6 F (37 C) (07/10 0430) Pulse Rate:  [68-125] 68 (07/10 0430) Resp:  [18-20] 18 (07/10 0430) BP: (132-135)/(80-82) 134/82 mmHg (07/10 0430) SpO2:  [98 %] 98 % (07/10 0430)  Weight change:  Filed Weights   05/23/16 1829 05/23/16 2138  Weight: 72.576 kg (160 lb) 82.146 kg (181 lb 1.6 oz)    Intake/Output: I/O last 3 completed shifts: In: 1632 [P.O.:480; I.V.:1132; Other:20] Out: 650 [Urine:650]   Intake/Output this shift:     Physical Exam: General: NAD, sitting in bed  Head: Normocephalic, atraumatic. Moist oral mucosal membranes  Eyes: Anicteric  Neck: Supple, trachea midline  Lungs:  Clear to auscultation, normal effort  Heart: S1S2 no rubs  Abdomen:  Soft, nontender, BS present   Extremities: 1+ peripheral edema.  Neurologic: Awake, alert, following commands  Skin: No lesions       Basic Metabolic Panel:  Recent Labs Lab 05/23/16 1823  05/24/16 0206 05/24/16 0342 05/24/16 0545 05/25/16 0443 05/25/16 1740 05/25/16 2227 05/26/16 0236 05/26/16 0627  NA 118*  < > 120* 122* 122* 120* 119* 119* 120* 119*  K 4.2  < > 4.0 4.4 4.1 4.3  --   --  4.2  --   CL 73*  < > 82* 82* 83* 82*  --   --  83*  --   CO2 37*  < > 32 35* 35* 32  --   --  31  --   GLUCOSE 124*  < > 67 50* 37* 153*  --   --  118*  --   BUN 16  < > --   --  12  --   CREATININE 0.86  < > 0.67 0.76 0.74 0.75  --   --  0.66  --   CALCIUM 8.5*  < > 7.8* 8.0* 8.0* 7.7*  --   --  7.9*  --   MG 1.6*  --   --   --   --   --   --   --   --   --   < > = values in this interval not displayed.  Liver Function Tests:  Recent Labs Lab 05/23/16 1823  AST 22  ALT 16  ALKPHOS 157*  BILITOT 0.6  PROT 5.9*  ALBUMIN 2.8*   No results for input(s): LIPASE, AMYLASE in the last 168  hours. No results for input(s): AMMONIA in the last 168 hours.  CBC:  Recent Labs Lab 05/23/16 1823 05/24/16 0545 05/25/16 0443 05/26/16 0236  WBC 8.0 8.7 9.0 9.7  NEUTROABS  --   --  6.0 7.1*  HGB 11.1* 10.2* 10.5* 10.8*  HCT 32.3* 29.8* 30.1* 32.1*  MCV 86.4 87.3 87.0 87.0  PLT 274 258 256 291    Cardiac Enzymes: No results for input(s): CKTOTAL, CKMB, CKMBINDEX, TROPONINI in the last 168 hours.  BNP: Invalid input(s): POCBNP  CBG:  Recent Labs Lab 05/25/16 1136 05/25/16 1646 05/25/16 2045 05/26/16 0814 05/26/16 1121  GLUCAP 184* 220* 102* 162* 167*    Microbiology: Results for orders placed or performed during the hospital encounter of 05/23/16  Urine culture     Status: Abnormal   Collection Time: 05/23/16  9:49 PM  Result Value Ref Range Status  Specimen Description URINE, RANDOM  Final   Special Requests Normal  Final   Culture MULTIPLE SPECIES PRESENT, SUGGEST RECOLLECTION (A)  Final   Report Status 05/25/2016 FINAL  Final  MRSA PCR Screening     Status: None   Collection Time: 05/23/16 10:14 PM  Result Value Ref Range Status   MRSA by PCR NEGATIVE NEGATIVE Final    Comment:        The GeneXpert MRSA Assay (FDA approved for NASAL specimens only), is one component of a comprehensive MRSA colonization surveillance program. It is not intended to diagnose MRSA infection nor to guide or monitor treatment for MRSA infections.     Coagulation Studies: No results for input(s): LABPROT, INR in the last 72 hours.  Urinalysis:  Recent Labs  05/23/16 1855  COLORURINE YELLOW*  LABSPEC 1.008  PHURINE 6.0  GLUCOSEU NEGATIVE  HGBUR 1+*  BILIRUBINUR NEGATIVE  KETONESUR NEGATIVE  PROTEINUR NEGATIVE  NITRITE POSITIVE*  LEUKOCYTESUR 3+*      Imaging: Dg Chest 2 View  05/25/2016  CLINICAL DATA:  82 year old female with generalized weakness, hypoxia. Initial encounter. EXAM: CHEST  2 VIEW COMPARISON:  04/06/2016 and earlier. FINDINGS: Seated AP  and lateral views of the chest. Small to moderate bilateral pleural effusions including fluid tracking into the major fissures. Cardiomegaly. Other mediastinal contours are within normal limits. Increased interstitial opacity favored due to congested pulmonary vasculature. Interval removal right PICC line. Osteopenia. No acute osseous abnormality identified. Stable cholecystectomy clips. IMPRESSION: Appearance most compatible with pulmonary interstitial edema and small to moderate bilateral pleural effusions. Electronically Signed   By: Odessa Fleming M.D.   On: 05/25/2016 09:20     Medications:     . dextrose  25 mL Intravenous Once  . enoxaparin  40 mg Subcutaneous Q24H  . feeding supplement (GLUCERNA SHAKE)  237 mL Oral TID BM  . insulin aspart  0-15 Units Subcutaneous TID WC  . insulin aspart  0-5 Units Subcutaneous QHS  . levothyroxine  100 mcg Oral QAC breakfast  . senna-docusate  1 tablet Oral BID   acetaminophen **OR** acetaminophen, HYDROcodone-acetaminophen, ondansetron **OR** ondansetron (ZOFRAN) IV, polyethylene glycol  Assessment/ Plan:  82 y.o. female with a PMHx of Depression, BPH, hyperlipidemia, GERD, vitamin D deficiency, constipation, hypertension, multiple sclerosis, coronary artery disease, chronic pain syndrome, who was admitted to [facility] on 05/23/2016 for evaluation of acute renal failure.   1. Hyponatremia, low serum and urine osm, with poor solute intake 2. Urinary retention 3. Lower extremity edema 4. Diabetes mellitus type II  Plan:   Unclear how aggressive care should be.  Start salt tablets.  Not a candidate for tolvaptan  Make sure patient is not getting diuretics.     LOS: 3 Trixy Loyola 7/10/201712:25 PM

## 2016-05-26 NOTE — Clinical Social Work Note (Signed)
Clinical Social Work Assessment  Patient Details  Name: Sierra Warren MRN: [id number] Date of Birth: December 05, 1920  Date of referral:  05/25/16               Reason for consult:  Facility Placement                Permission sought to share information with:  Family Supports Permission granted to share information::  Yes, Verbal Permission Granted  Name::     [name]  Relationship::  daughter  Contact Information:  [phone]  Housing/Transportation Living arrangements for the past 2 months:  [place] of Information:  Adult Children Patient Interpreter Needed:  None Criminal Activity/Legal Involvement Pertinent to Current Situation/Hospitalization:  No - Comment as needed Significant Relationships:  Adult Children Lives with:  Facility Resident Do you feel safe going back to the place where you live?  Yes Need for family participation in patient care:  Yes (Comment)  Care giving concerns:  No care giving concerns identified.   Social Worker assessment / plan:  CSW met with pt and daughter to address consult as pt was admitted from [facility]. CSW introduced herself and explained role of social work. Pt has been at [facility] for STR, however per pt's daughter, pt will be transitioned to LTC. Pt's family has applied for Medicaid. CSW will follow up with facility regarding pt's return. CSW will continue to follow.   Employment status:  Retired Nurse, adult PT Recommendations:  Not assessed at this time Information / Referral to community resources:  [place] ([facility])  Patient/Family's Response to care:  Pt's daughter was Patent attorney of CSW support.   Patient/Family's Understanding of and Emotional Response to Diagnosis, Current Treatment, and Prognosis:  Pt would like daughter to return to [facility] at discharge.   Emotional Assessment Appearance:  Appears stated age Attitude/Demeanor/Rapport:   Other (Appropriate) Affect (typically observed):  Pleasant Orientation:  Oriented to Self, Oriented to Place, Oriented to  Time, Oriented to Situation Alcohol / Substance use:  Never Used Psych involvement (Current and /or in the community):  No (Comment)  Discharge Needs  Concerns to be addressed:  Adjustment to Illness Readmission within the last 30 days:    Current discharge risk:  None Barriers to Discharge:  Continued Medical Work up   [employer], LCSW 05/26/2016, 11:05 AM

## 2016-05-26 NOTE — Progress Notes (Signed)
[facility] [place] [facility]Diagn[facility]   PATIENT NAME: Sierra Warren    MR#:  [id number]  DATE OF BIRTH:  [birth year]  SUBJECTIVE:  CHIEF COMPLAINT:   Chief Complaint  Patient presents with  . Labs Only  achy all over, Received tylenol 10-15 mins earlier. Na 119 this am. Per nursing she gets confused intermittently    REVIEW OF SYSTEMS:    Review of Systems  Constitutional: Positive for malaise/fatigue. Negative for fever and chills.  HENT: Negative for sore throat.   Eyes: Negative for blurred vision, double vision and pain.  Respiratory: Positive for cough. Negative for hemoptysis, shortness of breath and wheezing.   Cardiovascular: Negative for chest pain, palpitations, orthopnea and leg swelling.  Gastrointestinal: Negative for heartburn, nausea, vomiting, abdominal pain, diarrhea and constipation.  Genitourinary: Negative for dysuria and hematuria.  Musculoskeletal: Positive for myalgias and joint pain. Negative for back pain.  Skin: Negative for rash.  Neurological: Positive for weakness. Negative for sensory change, speech change, focal weakness and headaches.  Endo/Heme/Allergies: Does not bruise/bleed easily.  Psychiatric/Behavioral: Positive for depression. The patient is not nervous/anxious.    DRUG ALLERGIES:   Allergies  Allergen Reactions  . Codeine Other (See Comments)    Upset stomach, diarrhea  . Penicillins Swelling    VITALS:  Blood pressure 134/82, pulse 68, temperature 98.6 F (37 C), temperature source Oral, resp. rate 18, height  (1.6 m), weight 82.146 kg (181 lb 1.6 oz), SpO2 98 %. PHYSICAL EXAMINATION:   Physical Exam  Constitutional: She is oriented to person, place, and time. She appears malnourished.  HENT:  Head: Normocephalic and atraumatic.  Eyes: Conjunctivae and EOM are normal. Pupils are equal, round, and reactive to light.  Neck: Normal range of motion. Neck supple. No tracheal deviation present. No  thyromegaly present.  Cardiovascular: Normal rate, regular rhythm and normal heart sounds.   Pulmonary/Chest: Effort normal and breath sounds normal. No respiratory distress. She has no wheezes. She exhibits no tenderness.  Abdominal: Soft. Bowel sounds are normal. She exhibits no distension. There is no tenderness.  Musculoskeletal: Normal range of motion.  Neurological: She is alert and oriented to person, place, and time. No cranial nerve deficit.  Skin: Skin is warm and dry. No rash noted.  Psychiatric: Mood and affect normal.   LABORATORY PANEL:   CBC  Recent Labs Lab 05/26/16 0236  WBC 9.7  HGB 10.8*  HCT 32.1*  PLT 291   ------------------------------------------------------------------------------------------------------------------ Chemistries   Recent Labs Lab 05/23/16 1823  05/26/16 0236 05/26/16 0627  NA 118*  < > 120* 119*  K 4.2  < > 4.2  --   CL 73*  < > 83*  --   CO2 37*  < > 31  --   GLUCOSE 124*  < > 118*  --   BUN 16  < > 12  --   CREATININE 0.86  < > 0.66  --   CALCIUM 8.5*  < > 7.9*  --   MG 1.6*  --   --   --   AST 22  --   --   --   ALT 16  --   --   --   ALKPHOS 157*  --   --   --   BILITOT 0.6  --   --   --   < > = values in this interval not displayed.  ASSESSMENT AND PLAN:   . Hyponatremia, Hypovolemic - Na at 119 this  am. - not much effect of NS  - Nephro considering Tolvaptan vs 3% saline which seems reasonable  . UTI (lower urinary tract infection) - per UA, treated - urine c/s contaminated, will recollect if develops s/s - stop rocephin at this time. - no fever/leukocytosis  * Hypothyroidism - Continue synthroid  * Diabetes mellitus type 2 with hypoglycemia - Due to poor PO intake - much better controlled, off all meds at this time.  * HTN -Stable, not on any meds.  * Severe protein-calorie malnutrition - likely due to chronic poor PO nutrition - on protein shakes (glucerna) - not interested in feeding  tubes   Consult palliative care as patient has been worsening since her hip fracture.     All the records are reviewed and case discussed with Care Management/Social Worker. Management plans discussed with the  family and they are in agreement. (d/w daughter Britt Boozer on phone [phone])  CODE STATUS: DNR, await palliative care d/w family. [facility] r [facility] ([facility]), if Palliative care doesn't get to them today, I've offered to meet tomorrow at 11 am  DVT Prophylaxis: SCDs  TOTAL TIME TAKING CARE OF THIS PATIENT: 35 minutes.   POSSIBLE D/C IN 1-2 DAYS, DEPENDING ON CLINICAL CONDITION. And palliative care, Nephro eval  [facility], Shawneen Deetz M.D on 05/26/2016 at 6:59 AM  Between 7am to 6pm - Pager - [phone]  After 6pm go to [email or web address] - password [email or web address]  [facility]  Office  [phone]  CC: Primary care physician; Danella Penton, MD  Note: This dictation was prepared with Dragon dictation along with smaller phrase technology. Any transcriptional errors that result from this process are unintentional.

## 2016-05-26 NOTE — NC FL2 (Signed)
[place] MEDICAID FL2 LEVEL OF CARE SCREENING TOOL     IDENTIFICATION  Patient Name: Sierra Warren Birthdate: 12-Nov-1921 Sex: female Admission Date (Current Location): 05/23/2016  [place] and [place] Number:  Chiropodist and Address:  [facility], [address], [place], [place] [address]      Provider Number: [id number]  Attending Physician Name and Address:  Delfino Lovett, MD  Relative Name and Phone Number:       Current Level of Care: Hospital Recommended Level of Care: Skilled Nursing Facility Prior Approval Number:    Date Approved/Denied:   PASRR Number: [id number]  Discharge Plan: SNF    Current Diagnoses: Patient Active Problem List   Diagnosis Date Noted  . Hyponatremia 05/23/2016  . Diabetes mellitus (HCC) 05/23/2016  . UTI (lower urinary tract infection) 05/23/2016  . Hypothyroidism 05/23/2016  . HTN (hypertension) 05/23/2016  . Hip fracture (HCC) 04/03/2016    Orientation RESPIRATION BLADDER Height & Weight     Self, Situation, Time  O2 (2L) Incontinent Weight: 181 lb 1.6 oz (82.146 kg) Height:  5\' 3"  (160 cm)  BEHAVIORAL SYMPTOMS/MOOD NEUROLOGICAL BOWEL NUTRITION STATUS      Incontinent Diet (Regular Diet)  AMBULATORY STATUS COMMUNICATION OF NEEDS Skin   Extensive Assist   Normal                       Personal Care Assistance Level of Assistance  Bathing, Feeding, Dressing Bathing Assistance: Maximum assistance Feeding assistance: Limited assistance Dressing Assistance: Maximum assistance     Functional Limitations Info  Sight, Hearing, Speech Sight Info: Adequate Hearing Info: Adequate Speech Info: Adequate    SPECIAL CARE FACTORS FREQUENCY                       Contractures Contractures Info: Not present    Additional Factors Info  Code Status, Allergies Code Status Info: DNR Allergies Info: Codeine, Penicillins           Current Medications (05/26/2016):  This is the current  hospital active medication list Current Facility-Administered Medications  Medication Dose Route Frequency Provider Last Rate Last Dose  . acetaminophen (TYLENOL) tablet 650 mg  650 mg Oral Q6H PRN Debby Crosley, MD   650 mg at 05/26/16 8786   Or  . acetaminophen (TYLENOL) suppository 650 mg  650 mg Rectal Q6H PRN Debby Crosley, MD      . dextrose 50 % solution 25 mL  25 mL Intravenous Once Debby Crosley, MD   25 mL at 05/24/16 0631  . enoxaparin (LOVENOX) injection 40 mg  40 mg Subcutaneous Q24H Debby Crosley, MD   40 mg at 05/23/16 2233  . feeding supplement (GLUCERNA SHAKE) (GLUCERNA SHAKE) liquid 237 mL  237 mL Oral TID BM Srikar Sudini, MD   237 mL at 05/25/16 1000  . HYDROcodone-acetaminophen (NORCO/VICODIN) 5-325 MG per tablet 1 tablet  1 tablet Oral Q6H PRN Vipul Shah, MD      . insulin aspart (novoLOG) injection 0-15 Units  0-15 Units Subcutaneous TID WC Gery Pray, MD   3 Units at 05/26/16 0829  . insulin aspart (novoLOG) injection 0-5 Units  0-5 Units Subcutaneous QHS Gery Pray, MD   2 Units at 05/24/16 2155  . levothyroxine (SYNTHROID, LEVOTHROID) tablet 100 mcg  100 mcg Oral QAC breakfast Gery Pray, MD   100 mcg at 05/26/16 0829  . ondansetron (ZOFRAN) tablet 4 mg  4 mg Oral Q6H PRN Debby  Crosley, MD       Or  . ondansetron (ZOFRAN) injection 4 mg  4 mg Intravenous Q6H PRN Debby Crosley, MD      . polyethylene glycol (MIRALAX / GLYCOLAX) packet 17 g  17 g Oral Daily PRN Milagros Loll, MD   17 g at 05/25/16 1034  . senna-docusate (Senokot-S) tablet 1 tablet  1 tablet Oral BID Milagros Loll, MD   1 tablet at 05/26/16 1610     Discharge Medications: Please see discharge summary for a list of discharge medications.  Relevant Imaging Results:  Relevant Lab Results:   Additional Information SSN:  [id number]  Dede Query, LCSW

## 2016-05-26 NOTE — Care Management Important Message (Signed)
Important Message  Patient Details  Name: Sierra Warren MRN: [id number] Date of Birth: [birth year]   Medicare Important Message Given:  Yes    Gwenette Greet, RN 05/26/2016, 8:54 AM

## 2016-05-27 DIAGNOSIS — Z66 Do not resuscitate: Secondary | ICD-10-CM

## 2016-05-27 DIAGNOSIS — R52 Pain, unspecified: Secondary | ICD-10-CM

## 2016-05-27 DIAGNOSIS — E871 Hypo-osmolality and hyponatremia: Principal | ICD-10-CM

## 2016-05-27 DIAGNOSIS — Z515 Encounter for palliative care: Secondary | ICD-10-CM

## 2016-05-27 LAB — BASIC METABOLIC PANEL
Anion gap: 8 (ref 5–15)
BUN: 15 mg/dL (ref 6–20)
CHLORIDE: 84 mmol/L — AB (ref 101–111)
CO2: 29 mmol/L (ref 22–32)
CREATININE: 0.73 mg/dL (ref 0.44–1.00)
Calcium: 8.3 mg/dL — ABNORMAL LOW (ref 8.9–10.3)
GFR calc non Af Amer: >60 mL/min (ref >60)
Glucose, Bld: 194 mg/dL — ABNORMAL HIGH (ref 65–99)
POTASSIUM: 4.8 mmol/L (ref 3.5–5.1)
SODIUM: 121 mmol/L — AB (ref 135–145)

## 2016-05-27 LAB — CBC
HCT: 31.7 % — ABNORMAL LOW (ref 35.0–47.0)
HEMOGLOBIN: 10.9 g/dL — AB (ref 12.0–16.0)
MCH: 30 pg (ref 26.0–34.0)
MCHC: 34.5 g/dL (ref 32.0–36.0)
MCV: 87 fL (ref 80.0–100.0)
Platelets: 311 10*3/uL (ref 150–440)
RBC: 3.65 MIL/uL — AB (ref 3.80–5.20)
RDW: 15.4 % — ABNORMAL HIGH (ref 11.5–14.5)
WBC: 9.1 10*3/uL (ref 3.6–11.0)

## 2016-05-27 LAB — GLUCOSE, CAPILLARY
GLUCOSE-CAPILLARY: 202 mg/dL — AB (ref 65–99)
GLUCOSE-CAPILLARY: 165 mg/dL — AB (ref 65–99)

## 2016-05-27 MED ORDER — MORPHINE SULFATE (PF) 2 MG/ML IV SOLN
1.0000 mg | INTRAVENOUS | Status: DC | PRN
  Administered 2016-05-27: 1 mg via INTRAVENOUS
  Filled 2016-05-27: qty 1

## 2016-05-27 MED ORDER — MORPHINE SULFATE (PF) 2 MG/ML IV SOLN
1.0000 mg | INTRAVENOUS | Status: DC | PRN
  Administered 2016-05-27: 12:00:00 1 mg via INTRAVENOUS
  Filled 2016-05-27: qty 1

## 2016-05-27 MED ORDER — BISACODYL 10 MG RE SUPP: 10.0000 mg | Freq: Every day | RECTAL | Status: DC | PRN

## 2016-05-27 MED ORDER — MORPHINE SULFATE (PF) 2 MG/ML IV SOLN: 2.0000 mg | INTRAVENOUS | Status: AC | PRN

## 2016-05-27 MED ORDER — MORPHINE SULFATE (PF) 2 MG/ML IV SOLN
2.0000 mg | INTRAVENOUS | Status: DC | PRN
  Administered 2016-05-27: 16:00:00 2 mg via INTRAVENOUS
  Filled 2016-05-27: qty 1

## 2016-05-27 MED ORDER — LORAZEPAM 2 MG/ML IJ SOLN: 1.0000 mg | INTRAMUSCULAR | Status: AC | PRN

## 2016-05-27 MED ORDER — LORAZEPAM 2 MG/ML IJ SOLN
1.0000 mg | INTRAMUSCULAR | Status: DC | PRN
  Administered 2016-05-27: 1 mg via INTRAVENOUS
  Filled 2016-05-27: qty 1

## 2016-05-27 NOTE — Progress Notes (Signed)
New Hospice home referral received from CSW Sarah McNulty. Sierra Warren is a 82 year old woman admitted from [facility] on 7/7 for evaluation of hyponatremia. She has a PMH of DM II, thyroid disease, HTN and recent right ORIF. Per chart note review she has not done well at rehab, with poor oral intake. Nephrology and Palliative Medicine have been consulted. Family met with attending physician Dr. Shah and have decided to focus on patient's comfort and have request transfer to the hospice home. Patient seen lying ion bed, eyes closed, some moaning noted during visit. She has received IV morphine x 2 doses for management of pain and one dose of IV lorazepam. Family reports her pain is usually in her right hip. °Writer met in the patient's room with her 3 daughter's, son in law and granddaughter to initiate education regarding hospice services, philosophy and team approach to care with good understanding voiced. Questions answered, consents signed. Information faxed to referral. Plan is for discharge to the Hospice home today via EMS with signed portable DNR in place. Hospital care team and family all in agreement. Report called to the Hospice home. EMS notified for transport. Thank you for the opportunity to be involved in the care of this patient and her family.  °Karen Robertson RN, BSN, CHN °[facility], Hospital Liaison °[phone] c °

## 2016-05-27 NOTE — Consult Note (Signed)
   [facility] CM Inpatient Consult   05/27/2016  Sierra Warren 1921-06-08 [id number]   Patient screened for potential [facility] Care Management services. Patient is eligible for [facility] Care [facility] Services. Electronic medical record reveals patient's discharge plan is hospice care. [facility] Care Management services not appropriate at this time. If patient's post hospital needs change please place a [facility] Care Management consult. For questions please contact:   Daxtin Leiker RN, BSN [facility] Liaison  [phone]) Business Mobile [phone]) Toll free office

## 2016-05-27 NOTE — Clinical Social Work Note (Addendum)
CSW received consult for residential hospice. CSW met with pt and family to offer choice. Pt's daughters chose [facility]. CSW made referral to [place]. CSW will continue to follow.   Darden Dates, MSW, LCSW  Clinical Social Worker  [phone]  ADDENDUM: CSW updated [facility].

## 2016-05-27 NOTE — Clinical Social Work Note (Signed)
Pt is ready for discharge today and will go to [facility]. Pt's family is aware and agreeable to discharge plan. Report will be called and [facility] will provide transportation. CSW is signing off as no further needs identified.   Dede Query, MSW, LCSW  Clinical Social Worker  [phone]

## 2016-05-27 NOTE — Discharge Summary (Signed)
[facility] [place] at [facility]   PATIENT NAME: Sierra Warren    MR#:  [id number]  DATE OF BIRTH:  01-15-1921  DATE OF ADMISSION:  05/23/2016 ADMITTING PHYSICIAN: Gery Pray, MD  DATE OF DISCHARGE: 05/27/2016  PRIMARY CARE PHYSICIAN: Danella Penton, MD    ADMISSION DIAGNOSIS:  Urinary retention [R33.9] Hyponatremia [E87.1] UTI (lower urinary tract infection) [N39.0]  DISCHARGE DIAGNOSIS:  Active Problems:   Hyponatremia   Diabetes mellitus (HCC)   UTI (lower urinary tract infection)   Hypothyroidism   HTN (hypertension)   Palliative care encounter   DNR (do not resuscitate)   Pain, generalized  SECONDARY DIAGNOSIS:   Past Medical History  Diagnosis Date  . Diabetes mellitus without complication (HCC)   . Thyroid disease   . Hypertension     HOSPITAL COURSE:  82 y.o. female admitted on 05/23/2016 with PMHx of Diabetes mellitus type 2, hypertension, status post cholecystectomy, recent right hip fracture status post intramedullary nail in the right hip, after which she was transferred to nursing home for rehabilitation which has been unsuccessful. She has decreased appetite, was recently placed and nutritional supplements She was sent to the ER. In the ER blood work done revealed a sodium of 118. Continued physical, functional and cognitive decline. Family faced with advanced directive and anticipatory care needs.  I had long discussion with family and plan is to focus on comfort only. [facility]'ve [facility] and is being D/C there.  Marland Kitchen Hyponatremia, Hypovolemic . UTI (lower urinary tract infection) * Hypothyroidism * Diabetes mellitus type 2 with hypoglycemia - Due to poor PO intake * HTN * Severe protein-calorie malnutrition  DISCHARGE CONDITIONS:   fair  CONSULTS OBTAINED:  Treatment Team:  Mady Haagensen, MD  DRUG ALLERGIES:   Allergies  Allergen Reactions  . Codeine Other (See Comments)   Upset stomach, diarrhea  . Penicillins Swelling    DISCHARGE MEDICATIONS:   Current Discharge Medication List    START taking these medications   Details  LORazepam (ATIVAN) 2 MG/ML injection Inject 0.5 mLs (1 mg total) into the vein every 4 (four) hours as needed for anxiety. Qty: 1 mL, Refills: 0    morphine 2 MG/ML injection Inject 1 mL (2 mg total) into the vein every hour as needed. Qty: 1 mL, Refills: 0      STOP taking these medications     ALPRAZolam (XANAX) 0.5 MG tablet      aspirin EC 81 MG tablet      docusate sodium (COLACE) 100 MG capsule      ferrous sulfate (FERROUSUL) 325 (65 FE) MG tablet      furosemide (LASIX) 20 MG tablet      glimepiride (AMARYL) 2 MG tablet      insulin NPH Human (HUMULIN N,NOVOLIN N) 100 UNIT/ML injection      ipratropium-albuterol (DUONEB) 0.5-2.5 (3) MG/3ML SOLN      levothyroxine (SYNTHROID, LEVOTHROID) 100 MCG tablet      LYRICA 50 MG capsule      Melatonin 3 MG TABS      metFORMIN (GLUCOPHAGE) 500 MG tablet      omeprazole (PRILOSEC) 20 MG capsule      polyethylene glycol (MIRALAX / GLYCOLAX) packet      senna (SENOKOT) 8.6 MG TABS tablet      traZODone (DESYREL) 50 MG tablet      venlafaxine XR (EFFEXOR-XR) 75 MG 24 hr capsule      enoxaparin (  LOVENOX) 30 MG/0.3ML injection      oxyCODONE (OXY IR/ROXICODONE) 5 MG immediate release tablet      zolpidem (AMBIEN) 5 MG tablet          DISCHARGE INSTRUCTIONS:  Comfort care only  DIET:  Pleasure feeding as appropriate  DISCHARGE CONDITION:  Serious  ACTIVITY:  Bedrest  OXYGEN:  Home Oxygen: Yes.     Oxygen Delivery: 2 liters/min via Patient connected to nasal cannula oxygen  DISCHARGE LOCATION:  [facility]   If you experience worsening of your admission symptoms, develop shortness of breath, life threatening emergency, suicidal or homicidal thoughts you must seek medical attention immediately by calling 911 or calling your MD immediately  if  symptoms less severe.  You Must read complete instructions/literature along with all the possible adverse reactions/side effects for all the Medicines you take and that have been prescribed to you. Take any new Medicines after you have completely understood and accpet all the possible adverse reactions/side effects.   Please note  You were cared for by a hospitalist during your hospital stay. If you have any questions about your discharge medications or the care you received while you were in the hospital after you are discharged, you can call the unit and asked to speak with the hospitalist on call if the hospitalist that took care of you is not available. Once you are discharged, your primary care physician will handle any further medical issues. Please note that NO REFILLS for any discharge medications will be authorized once you are discharged, as it is imperative that you return to your primary care physician (or establish a relationship with a primary care physician if you do not have one) for your aftercare needs so that they can reassess your need for medications and monitor your lab values.    On the day of Discharge:  VITAL SIGNS:  Blood pressure 122/89, pulse 84, temperature 97.9 F (36.6 C), temperature source Oral, resp. rate 17, height 5\' 3"  (1.6 m), weight 82.146 kg (181 lb 1.6 oz), SpO2 97 %. PHYSICAL EXAMINATION:  GENERAL:  82 y.o.-year-old patient lying in the bed with no acute distress.  EYES: Pupils equal, round, reactive to light and accommodation. No scleral icterus. Extraocular muscles intact.  HEENT: Head atraumatic, normocephalic. Oropharynx and nasopharynx clear.  NECK:  Supple, no jugular venous distention. No thyroid enlargement, no tenderness.  LUNGS: Normal breath sounds bilaterally, no wheezing, rales,rhonchi or crepitation. No use of accessory muscles of respiration.  CARDIOVASCULAR: S1, S2 normal. No murmurs, rubs, or gallops.  ABDOMEN: Soft, non-tender,  non-distended. Bowel sounds present. No organomegaly or mass.  EXTREMITIES: No pedal edema, cyanosis, or clubbing.  NEUROLOGIC: Cranial nerves II through XII are intact. Muscle strength 5/5 in all extremities. Sensation intact. Gait not checked.  PSYCHIATRIC: The patient is lethargic SKIN: No obvious rash, lesion, or ulcer.  DATA REVIEW:   CBC  Recent Labs Lab 05/27/16 0554  WBC 9.1  HGB 10.9*  HCT 31.7*  PLT 311    Chemistries   Recent Labs Lab 05/23/16 1823  05/27/16 0554  NA 118*  < > 121*  K 4.2  < > 4.8  CL 73*  < > 84*  CO2 37*  < > 29  GLUCOSE 124*  < > 194*  BUN 16  < > 15  CREATININE 0.86  < > 0.73  CALCIUM 8.5*  < > 8.3*  MG 1.6*  --   --   AST 22  --   --  ALT 16  --   --   ALKPHOS 157*  --   --   BILITOT 0.6  --   --   < > = values in this interval not displayed.    Management plans discussed with the patient, family and they are in agreement.  CODE STATUS: DNR, comfort care  TOTAL TIME TAKING CARE OF THIS PATIENT: 45 minutes.    [facility], Bobi Daudelin M.D on 05/27/2016 at 2:21 PM  Between 7am to 6pm - Pager - [phone]  After 6pm go to [email or web address] - password [email or web address]  [facility]  Office  [phone]  CC: Primary care physician; Danella Penton, MD   Note: This dictation was prepared with Dragon dictation along with smaller phrase technology. Any transcriptional errors that result from this process are unintentional.

## 2016-05-27 NOTE — Consult Note (Signed)
Consultation Note Date: 05/27/2016   Patient Name: Sierra Warren  DOB: 05-Feb-1921  MRN: [id number]  Age / Sex: 82 y.o., female  PCP: Danella Penton, MD Referring Physician: Delfino Lovett, MD  Reason for Consultation: Establishing goals of care, Hospice Evaluation, Non pain symptom management, Pain control and Psychosocial/spiritual support  HPI/Patient Profile: 82 y.o. female   admitted on 05/23/2016 with PMHx of Diabetes mellitus type 2, hypertension, status post cholecystectomy, recent right hip fracture status post intramedullary nail in the right hip,  She was transferred to nursing home for rehabilitation which has been unsuccessful.   She has decreased appetite, was recently placed and nutritional supplements   She was sent to the ER. In the ER blood work done revealed a sodium of 118.   Continued physical, functional and cognitive decline.  Family faced with advanced directive and anticipatory care needs.  Dr Sherryll Burger has discussed with family and plan is to focus on comfort   Clinical Assessment and Goals of Care:  This NP Lorinda Creed reviewed medical records, received report from team, assessed the patient and then meet at the patient's bedside along with her large family, three daughters, brothers, sisters, grand-son  to discuss diagnosis, prognosis, GOC, EOL wishes disposition and options.   A  discussion was had today regarding advanced directives.  Concepts specific to code status, artifical feeding and hydration, continued IV antibiotics and rehospitalization was had.  The difference between a aggressive medical intervention path  and a palliative comfort care path for this patient at this time was had.  Values and goals of care important to patient and family were attempted to be elicited.  Concept of Hospice facility was discussed  Natural trajectory and expectations at EOL were discussed.  Questions  and concerns addressed.  Hard Choices booklet left for review. Family encouraged to call with questions or concerns.  PMT will continue to support holistically.    SUMMARY OF RECOMMENDATIONS    Code Status/Advance Care Planning:  DNR    Symptom Management:   Pain/Dyspnea: Morphine 2 mg every 1 hr prn  Agitation: Ativan 1 mg IV every 4 hrs prn  Palliative Prophylaxis:   Aspiration, Bowel Regimen, Frequent Pain Assessment, Oral Care and Turn Reposition  Additional Recommendations (Limitations, Scope, Preferences):  Full Comfort Care  Psycho-social/Spiritual:   Desire for further Chaplaincy support:yes  Additional Recommendations: Education on Hospice  Prognosis:   < 2 weeks, poor po intake,, hyponatremia (112), no further diagnostics or treatment, no artificial feeding or hydration.  Symptom management needs  Discharge Planning: Hospice facility      Primary Diagnoses: Present on Admission:  . Hyponatremia . UTI (lower urinary tract infection)  I have reviewed the medical record, interviewed the patient and family, and examined the patient. The following aspects are pertinent.  Past Medical History  Diagnosis Date  . Diabetes mellitus without complication (HCC)   . Thyroid disease   . Hypertension    Social History   Social History  . Marital Status: Widowed  Spouse Name: N/A  . Number of Children: N/A  . Years of Education: N/A   Social History Main Topics  . Smoking status: Never Smoker   . Smokeless tobacco: None     Comment: no smokers in her home  . Alcohol Use: No  . Drug Use: No  . Sexual Activity: Not Asked   Other Topics Concern  . None   Social History Narrative   No family history on file. Scheduled Meds: . dextrose  25 mL Intravenous Once  . feeding supplement (GLUCERNA SHAKE)  237 mL Oral TID BM   Continuous Infusions:  PRN Meds:.acetaminophen **OR** acetaminophen, HYDROcodone-acetaminophen, morphine injection, ondansetron  **OR** ondansetron (ZOFRAN) IV, polyethylene glycol, zolpidem Medications Prior to Admission:  Prior to Admission medications   Medication Sig Start Date End Date Taking? Authorizing Provider  ALPRAZolam Prudy Feeler) 0.5 MG tablet Take 1 tablet (0.5 mg total) by mouth 2 (two) times daily as needed. 04/07/16  Yes Enid Baas, MD  aspirin EC 81 MG tablet Take 1 tablet by mouth every other day. 07/27/12  Yes Historical Provider, MD  docusate sodium (COLACE) 100 MG capsule Take 1 capsule (100 mg total) by mouth 2 (two) times daily. Patient taking differently: Take 100 mg by mouth daily.  04/07/16  Yes Enid Baas, MD  ferrous sulfate (FERROUSUL) 325 (65 FE) MG tablet Take 1 tablet (325 mg total) by mouth 2 (two) times daily with a meal. 04/07/16  Yes Enid Baas, MD  furosemide (LASIX) 20 MG tablet Take 20 mg by mouth daily.   Yes Historical Provider, MD  glimepiride (AMARYL) 2 MG tablet Take 2 tablets by mouth daily. 03/04/16  Yes Historical Provider, MD  insulin NPH Human (HUMULIN N,NOVOLIN N) 100 UNIT/ML injection Inject 3-8 Units into the skin 2 (two) times daily. 8 units @@ 0800, 3 units at 1800   Yes Historical Provider, MD  ipratropium-albuterol (DUONEB) 0.5-2.5 (3) MG/3ML SOLN Take 3 mLs by nebulization every 4 (four) hours as needed (cough).   Yes Historical Provider, MD  levothyroxine (SYNTHROID, LEVOTHROID) 100 MCG tablet Take 1 tablet by mouth daily. 03/04/16  Yes Historical Provider, MD  LYRICA 50 MG capsule Take 1 capsule by mouth daily.  03/14/16  Yes Historical Provider, MD  Melatonin 3 MG TABS Take 1 tablet by mouth at bedtime.   Yes Historical Provider, MD  metFORMIN (GLUCOPHAGE) 500 MG tablet Take 1 tablet by mouth daily. 02/12/16  Yes Historical Provider, MD  omeprazole (PRILOSEC) 20 MG capsule Take 20 mg by mouth daily.   Yes Historical Provider, MD  polyethylene glycol (MIRALAX / GLYCOLAX) packet Take 17 g by mouth 2 (two) times daily.   Yes Historical Provider, MD  senna  (SENOKOT) 8.6 MG TABS tablet Take 2 tablets (17.2 mg total) by mouth daily. 04/07/16  Yes Enid Baas, MD  traZODone (DESYREL) 50 MG tablet Take 150 mg by mouth at bedtime.   Yes Historical Provider, MD  venlafaxine XR (EFFEXOR-XR) 75 MG 24 hr capsule Take 225 mg by mouth daily.   Yes Historical Provider, MD  enoxaparin (LOVENOX) 30 MG/0.3ML injection Inject 0.3 mLs (30 mg total) into the skin daily. X 12 more days Patient not taking: Reported on 05/23/2016 04/07/16   Enid Baas, MD  oxyCODONE (OXY IR/ROXICODONE) 5 MG immediate release tablet Take 1-2 tablets (5-10 mg total) by mouth every 4 (four) hours as needed for moderate pain, severe pain or breakthrough pain. 04/07/16   Enid Baas, MD  zolpidem (AMBIEN) 5 MG tablet Take 1  tablet (5 mg total) by mouth at bedtime. Patient not taking: Reported on 05/23/2016 04/07/16   Enid Baas, MD   Allergies  Allergen Reactions  . Codeine Other (See Comments)    Upset stomach, diarrhea  . Penicillins Swelling   Review of Systems  Physical Exam  Constitutional: She appears well-developed. She appears lethargic. She appears ill.  HENT:  Mouth/Throat: Mucous membranes are dry.  Cardiovascular: Normal rate, regular rhythm and normal heart sounds.   Pulmonary/Chest: She has decreased breath sounds in the right lower field and the left lower field.  Neurological: She appears lethargic.  Skin: Skin is warm and dry.    Vital Signs: BP 122/89 mmHg  Pulse 84  Temp(Src) 97.9 F (36.6 C) (Oral)  Resp 17  Ht  (1.6 m)  Wt 82.146 kg (181 lb 1.6 oz)  BMI 32.09 kg/m2  SpO2 97% Pain Assessment: Faces POSS *See Group Information*: 1-Acceptable,Awake and alert Pain Score: Asleep   SpO2: SpO2: 97 % O2 Device:SpO2: 97 % O2 Flow Rate: .O2 Flow Rate (L/min): 2 L/min  IO: Intake/output summary:  Intake/Output Summary (Last 24 hours) at 05/27/16 1149 Last data filed at 05/27/16 0900  Gross per 24 hour  Intake      0 ml  Output     450 ml  Net   -450 ml    LBM: Last BM Date: 05/27/16 Baseline Weight: Weight: 72.576 kg (160 lb) Most recent weight: Weight: 82.146 kg (181 lb 1.6 oz)      Palliative Assessment/Data:20 %   Dr Sherryll Burger  Time In: 1130 Time Out: 1230 Time Total: 60 min Greater than 50%  of this time was spent counseling and coordinating care related to the above assessment and plan.  Signed by: Lorinda Creed, NP   Please contact Palliative Medicine Team phone at [phone] for questions and concerns.  For individual provider: See Loretha Stapler

## 2016-05-27 NOTE — Discharge Instructions (Signed)
Hospice °Hospice is a service that is designed to provide people who are terminally ill and their families with medical, spiritual, and psychological support. Its aim is to improve your quality of life by keeping you as alert and comfortable as possible. Hospice is performed by a team of health care professionals and volunteers who: °· Help keep you comfortable. Hospice can be provided in your home or in a homelike setting. The hospice staff works with your family and friends to help meet your needs. You will enjoy the support of loved ones by receiving much of your basic care from family and friends. °· Provide pain relief and manage your symptoms. The staff supply all necessary medicines and equipment. °· Provide companionship when you are alone. °· Allow you and your family to rest. They may do light housekeeping, prepare meals, and run errands. °· Provide counseling. They will make sure your emotional, spiritual, and social needs and those of your family are being met. °· Provide spiritual care. Spiritual care is individualized to meet your needs and your family's needs. It may involve helping you look at what death means to you, say goodbye, or perform a specific religious ceremony or ritual. °Hospice teams often include: °· A nurse. °· A doctor. °· Social workers. °· Religious leaders (such as a chaplain). °· Trained volunteers. °WHEN SHOULD HOSPICE CARE BEGIN? °Most people who use hospice are believed to have fewer than 6 months to live. Your family and health care providers can help you decide when hospice services should begin. If your condition improves, you may discontinue the program. °WHAT SHOULD I CONSIDER BEFORE SELECTING A PROGRAM? °Most hospice programs are run by nonprofit, independent organizations. Some are affiliated with hospitals, nursing homes, or home health care agencies. Hospice programs can take place in the home or at a hospice center, hospital, or skilled nursing facility. When choosing  a hospice program, ask the following questions: °· What services are available to me? °· What services are offered to my loved ones? °· How involved are my loved ones? °· How involved is my health care provider? °· Who makes up the hospice care team? How are they trained or screened? °· How will my pain and symptoms be managed? °· If my circumstances change, can the services be provided in a different setting, such as my home or in the hospital? °· Is the program reviewed and licensed by the state or certified in some other way? °WHERE CAN I LEARN MORE ABOUT HOSPICE? °You can learn about existing hospice programs in your area from your health care providers. You can also read more about hospice online. The websites of the following organizations contain helpful information: °· The National Hospice and Palliative Care Organization (NHPCO). °· The Hospice Association of America (HAA). °· The Hospice Education Institute. °· The American Cancer Society (ACS). °· Hospice Net. °  °This information is not intended to replace advice given to you by your health care provider. Make sure you discuss any questions you have with your health care provider. °  °Document Released: 02/20/2004 Document Revised: 11/08/2013 Document Reviewed: 09/13/2013 °Elsevier Interactive Patient Education ©2016 Elsevier Inc. ° °

## 2016-05-27 NOTE — Progress Notes (Signed)
Pt ate whole cup of applesauce with morning medications. Refused to eat breakfast with multiple attempts and encouragement just yelling "no!" and spitting out food.

## 2016-06-17 DEATH — deceased

## 2017-08-03 IMAGING — CR DG CHEST 1V
1 series · 1 of 1 positions shown · non-contrast
Comparison: 11/26/2015

CLINICAL DATA: [AGE] female with a history of right hip pain

EXAM:
CHEST 1 VIEW

[chest ap]
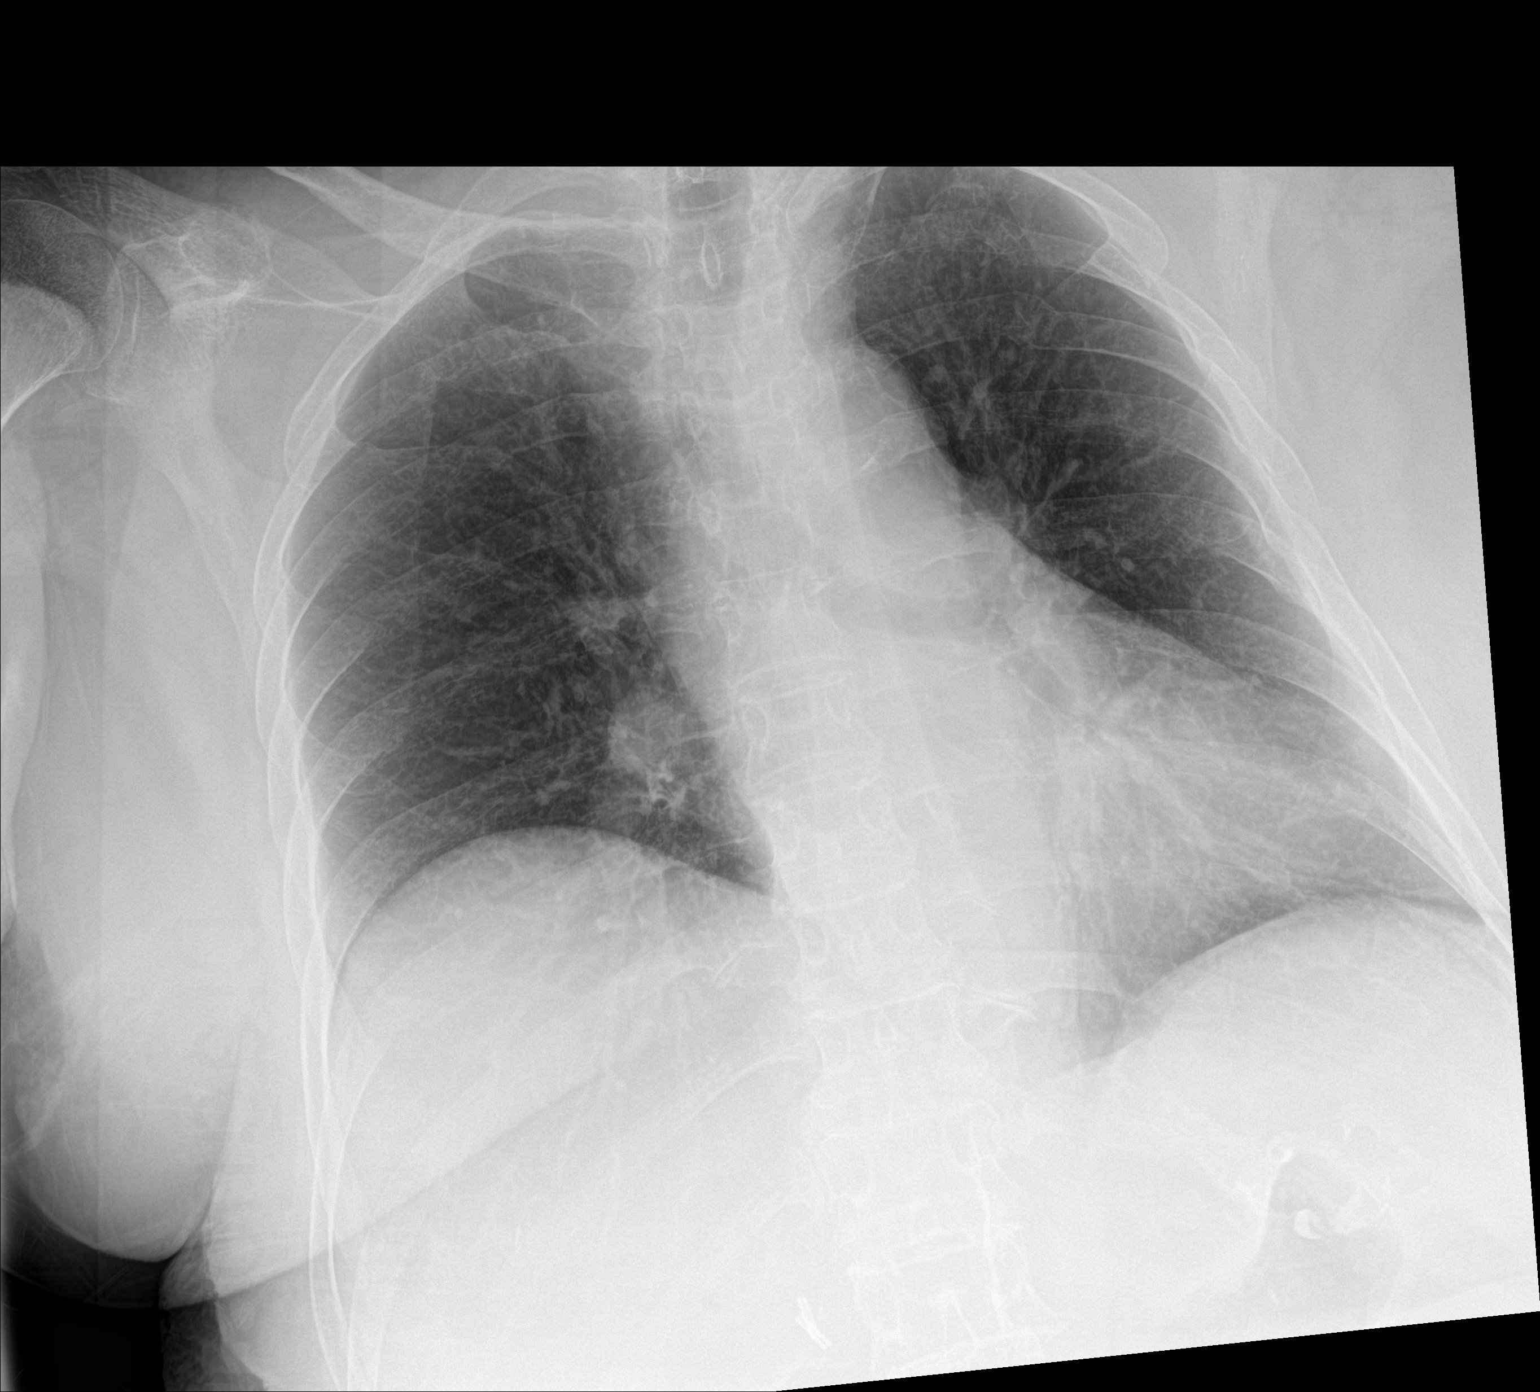

[1 of 1 positions shown; findings below may reference images not displayed]

FINDINGS: Cardiomediastinal silhouette unchanged in size and contour. No
evidence of central vascular congestion.

No confluent airspace disease or pneumothorax.  No pleural effusion.

No displaced fracture.  Healed left-sided rib fractures.

Surgical changes of the right upper abdomen.
IMPRESSION: Negative for acute cardiopulmonary disease.

## 2017-08-05 IMAGING — DX DG CHEST 1V
1 series · 1 of 1 positions shown · non-contrast
Comparison: 04/04/2016

CLINICAL DATA: Right PICC line placement. Fell on [DATE] with hip
surgery on [DATE]. History of diabetes, thyroid disease,
hypertension.

EXAM:
CHEST 1 VIEW

[chest ap]
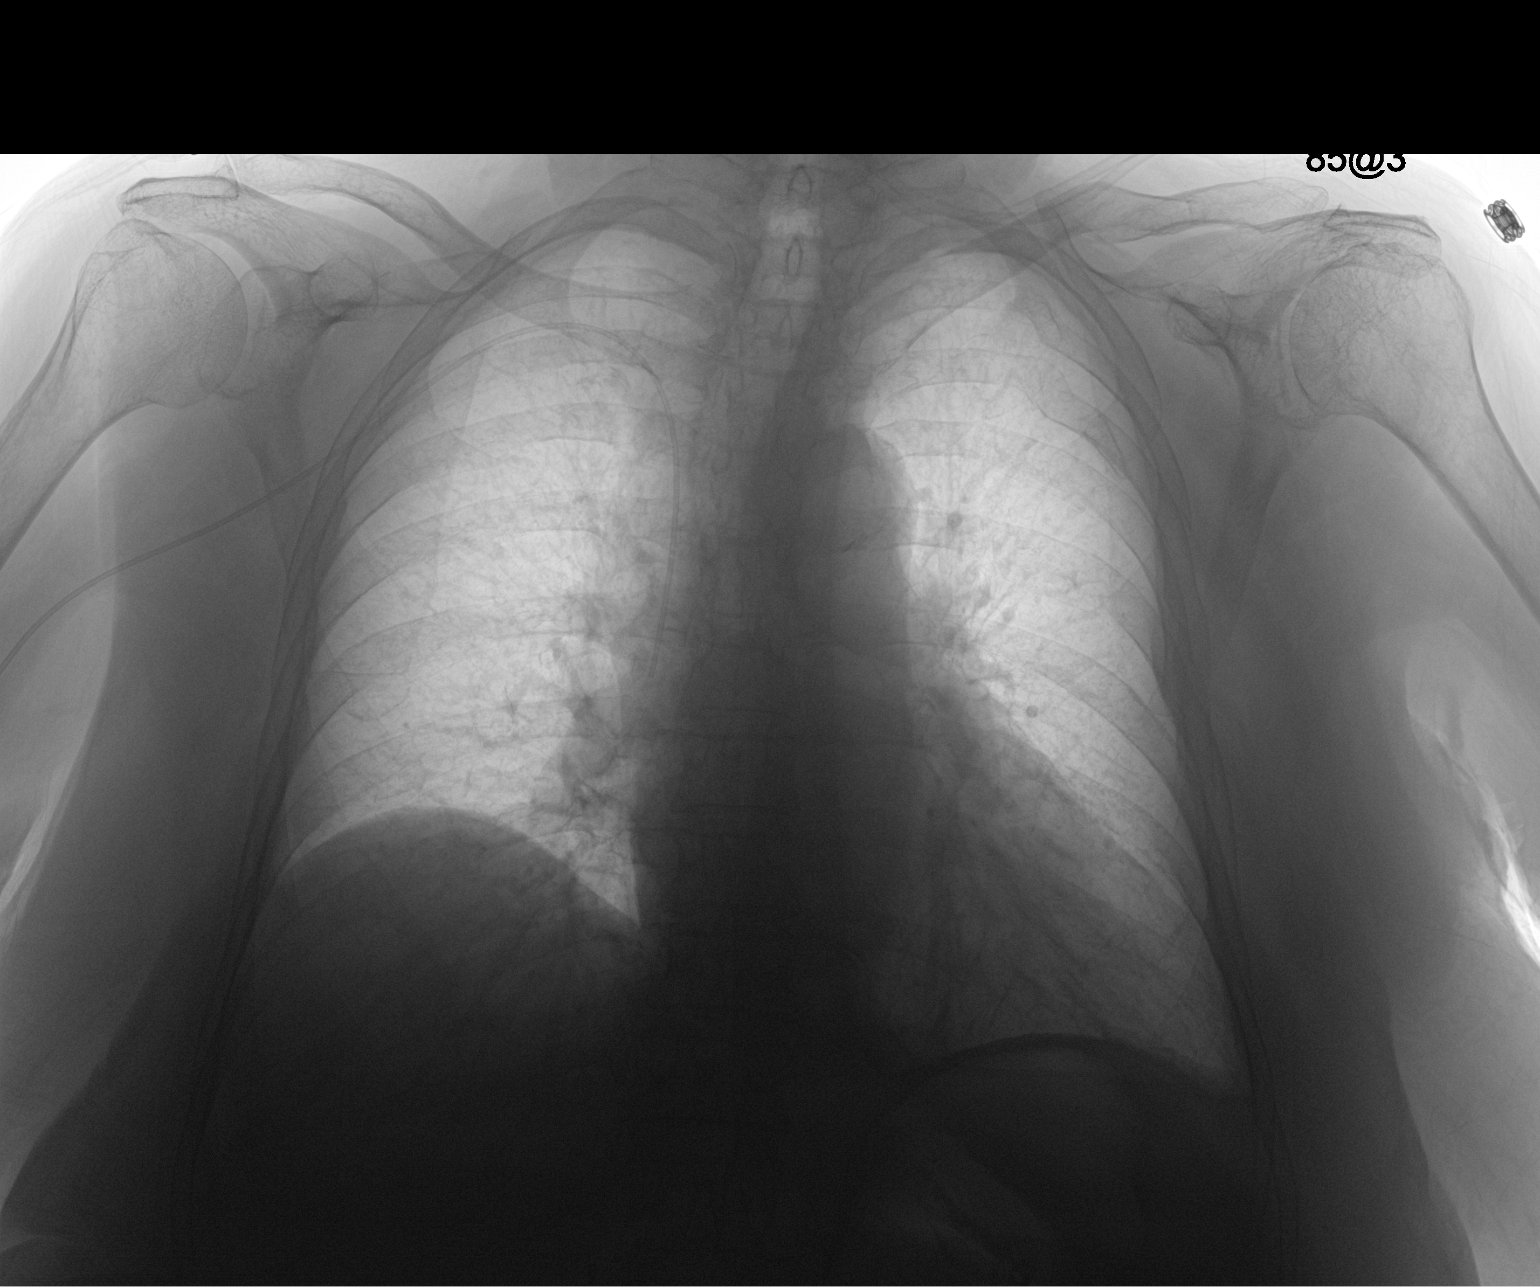

[1 of 1 positions shown; findings below may reference images not displayed]

FINDINGS: Interval placement of a right PICC catheter with tip over the low
SVC region. No pneumothorax. Normal heart size and pulmonary
vascularity. No focal airspace disease or consolidation in the
lungs. No blunting of costophrenic angles. No pneumothorax.
Mediastinal contours appear intact. Calcification of the aorta.
Degenerative changes in the shoulders. Old left rib fractures.
IMPRESSION: Right PICC line appears in satisfactory position. No evidence of
active pulmonary disease.
